# Patient Record
Sex: Female | Born: 1995 | Race: Black or African American | Hispanic: No | Marital: Single | Smoking: Never smoker
Health system: Southern US, Community
[De-identification: ages and names within clinical notes are randomized; demographics above are authoritative.]

## PROBLEM LIST (undated history)

## (undated) ENCOUNTER — Inpatient Hospital Stay (HOSPITAL_COMMUNITY): Payer: Self-pay

## (undated) DIAGNOSIS — K8050 Calculus of bile duct without cholangitis or cholecystitis without obstruction: Secondary | ICD-10-CM

## (undated) DIAGNOSIS — J309 Allergic rhinitis, unspecified: Secondary | ICD-10-CM

## (undated) DIAGNOSIS — F419 Anxiety disorder, unspecified: Secondary | ICD-10-CM

## (undated) DIAGNOSIS — O24419 Gestational diabetes mellitus in pregnancy, unspecified control: Secondary | ICD-10-CM

## (undated) DIAGNOSIS — G4730 Sleep apnea, unspecified: Secondary | ICD-10-CM

## (undated) DIAGNOSIS — E119 Type 2 diabetes mellitus without complications: Secondary | ICD-10-CM

## (undated) HISTORY — DX: Allergic rhinitis, unspecified: J30.9

## (undated) HISTORY — DX: Sleep apnea, unspecified: G47.30

## (undated) HISTORY — PX: WISDOM TOOTH EXTRACTION: SHX21

## (undated) HISTORY — DX: Gestational diabetes mellitus in pregnancy, unspecified control: O24.419

---
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
## 2013-04-07 ENCOUNTER — Encounter (HOSPITAL_COMMUNITY): Payer: Self-pay | Admitting: Emergency Medicine

## 2013-04-07 ENCOUNTER — Emergency Department (INDEPENDENT_AMBULATORY_CARE_PROVIDER_SITE_OTHER)
Admission: EM | Admit: 2013-04-07 | Discharge: 2013-04-07 | Disposition: A | Discharge status: Home / Self Care | Payer: Medicaid Other | Source: Home / Self Care | Attending: Emergency Medicine | Admitting: Emergency Medicine

## 2013-04-07 DIAGNOSIS — J019 Acute sinusitis, unspecified: Secondary | ICD-10-CM

## 2013-04-07 DIAGNOSIS — J020 Streptococcal pharyngitis: Secondary | ICD-10-CM

## 2013-04-07 MED ORDER — HYDROCOD POLST-CHLORPHEN POLST 10-8 MG/5ML PO LQCR: 5.0000 mL | Freq: Two times a day (BID) | ORAL | Status: DC | PRN

## 2013-04-07 MED ORDER — AMOXICILLIN 500 MG PO CAPS: 1000.0000 mg | ORAL_CAPSULE | Freq: Three times a day (TID) | ORAL | Status: DC

## 2013-04-07 NOTE — ED Provider Notes (Signed)
Chief Complaint:   Chief Complaint  Patient presents with  . 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 2 of her brothers were found to have strep. She relates a three-day history of sore throat, then a scratchy throat, nasal congestion with small amounts of yellow-green drainage, watering of her eyes, cough productive of green sputum, chest tightness, subjective fever, myalgias, headache, and nausea. She denies any earache, vomiting, or diarrhea.  Review of Systems:  Other than noted above, the patient denies any of the following symptoms: Systemic:  No fevers, chills, sweats, weight loss or gain, fatigue, or tiredness. Eye:  No redness or discharge. ENT:  No ear pain, drainage, headache, nasal congestion, drainage, sinus pressure, difficulty swallowing, or sore throat. Neck:  No neck pain or swollen glands. Lungs:  No cough, sputum production, hemoptysis, wheezing, chest tightness, shortness of breath or chest pain. GI:  No abdominal pain, nausea, vomiting or diarrhea.  PMFSH:  Past medical history, family history, social history, meds, and allergies were reviewed. She has seasonal allergies.  Physical Exam:   Vital signs:  BP 121/79  Pulse 100  Temp(Src) 97 F (36.1 C) (Oral)  Resp 19  SpO2 97%  LMP 03/15/2013 General:  Alert and oriented.  In no distress.  Skin warm and dry. Eye:  No conjunctival injection or drainage. Lids were normal. ENT:  TMs and canals were normal, without erythema or inflammation.  Nasal mucosa was clear and uncongested, without drainage.  Mucous membranes were moist.  Pharynx was clear with no exudate or drainage.  There were no oral ulcerations or lesions. Neck:  Supple, no adenopathy, tenderness or mass. Lungs:  No respiratory distress.  Lungs were clear to auscultation, without wheezes, rales or rhonchi.  Breath sounds were clear and equal bilaterally.  Heart:   Regular rhythm, without gallops, murmers or rubs. Skin:  Clear, warm, and dry, without rash or lesions.  Labs:   Results for orders placed during the hospital encounter of 04/07/13  POCT RAPID STREP A ([facility] ONLY)      Result Value Range   Streptococcus, Group A Screen (Direct) NEGATIVE  NEGATIVE    Assessment:  The primary encounter diagnosis was Strep throat. A diagnosis of Acute sinusitis was also pertinent to this visit.  Since she has an extensive exposure to strep, I think is reasonable to go ahead and treat with amoxicillin.  Plan:   1.  Meds:  The following meds were prescribed:   Discharge Medication List as of 04/07/2013  2:56 PM    START taking these medications   Details  amoxicillin (AMOXIL) 500 MG capsule Take 2 capsules (1,000 mg total) by mouth 3 (three) times daily., Starting 04/07/2013, Until Discontinued, Normal    chlorpheniramine-HYDROcodone (TUSSIONEX) 10-8 MG/5ML LQCR Take 5 mLs by mouth every 12 (twelve) hours as needed for cough., Starting 04/07/2013, Until Discontinued, Normal        2.  Patient Education/Counseling:  The patient was given appropriate handouts, self care instructions, and instructed in symptomatic relief.   3.  Follow up:  The patient was told to follow up if no better in 3 to 4 days, if becoming worse in any way, and given some red flag symptoms such as persistent symptoms which would prompt immediate return.  Follow up here as needed.      Reuben Likes, MD 04/07/13 [phone]

## 2013-04-07 NOTE — ED Notes (Signed)
Reports runny nose, cough, denies fever, reports drainage with sinus and chest congestion

## 2013-04-09 LAB — CULTURE, GROUP A STREP

## 2013-08-03 ENCOUNTER — Ambulatory Visit (INDEPENDENT_AMBULATORY_CARE_PROVIDER_SITE_OTHER): Payer: Medicaid Other | Admitting: Advanced Practice Midwife

## 2013-08-03 ENCOUNTER — Encounter: Payer: Self-pay | Admitting: Advanced Practice Midwife

## 2013-08-03 VITALS — BP 121/84 | HR 91 | Temp 97.9°F | Wt 277.0 lb

## 2013-08-03 DIAGNOSIS — IMO0001 Reserved for inherently not codable concepts without codable children: Secondary | ICD-10-CM

## 2013-08-03 DIAGNOSIS — E669 Obesity, unspecified: Secondary | ICD-10-CM | POA: Insufficient documentation

## 2013-08-03 DIAGNOSIS — N939 Abnormal uterine and vaginal bleeding, unspecified: Secondary | ICD-10-CM | POA: Insufficient documentation

## 2013-08-03 DIAGNOSIS — Z789 Other specified health status: Secondary | ICD-10-CM

## 2013-08-03 DIAGNOSIS — IMO0002 Reserved for concepts with insufficient information to code with codable children: Secondary | ICD-10-CM

## 2013-08-03 DIAGNOSIS — N926 Irregular menstruation, unspecified: Secondary | ICD-10-CM

## 2013-08-03 DIAGNOSIS — Z3009 Encounter for other general counseling and advice on contraception: Secondary | ICD-10-CM

## 2013-08-03 DIAGNOSIS — Z30017 Encounter for initial prescription of implantable subdermal contraceptive: Secondary | ICD-10-CM

## 2013-08-03 LAB — POCT URINE PREGNANCY: Preg Test, Ur: NEGATIVE

## 2013-08-03 NOTE — Progress Notes (Signed)
Patient is in the office today for a Problem Visit. Patient states her cycle in January only lasted for 3 days and her cycle in February lasted for 2 weeks and was heavy. Patient states she gets really bad cramping, nausea, light headed/ dizziness, and constipation. Patient would also like to know if she is on the right birth control pill. Patient states that her cycles became longer and that her cramping became worse when she started taking the birth control pills. Patient states she hasn't taken a pill for a week now.

## 2013-08-03 NOTE — Progress Notes (Signed)
Nexplanon Procedure Note   PRE-OP DIAGNOSIS: desired long-term, reversible contraception  POST-OP DIAGNOSIS: Same  PROCEDURE: Nexplanon  placement Performing Provider: Dory Horn CNM   Patient education prior to procedure, explained risk, benefits of Nexplanon, reviewed alternative options. Patient reported understanding. Gave consent to continue with procedure.   PROCEDURE:  Pregnancy Text :  Negative Site (check):      left arm         Sterile Preparation:   Betadinex3 Lot # [id number] Expiration Date 01/2016  Insertion site was selected 8 - 10 cm from medial epicondyle and marked along with guiding site using sterile marker. Procedure area was prepped and draped in a sterile fashion. Nexplanon  was inserted subcutaneously.Needle was removed from the insertion site. Nexplanon capsule was palpated by provider and patient to assure satisfactory placement. Dressing applied.  Followup: The patient tolerated the procedure well without complications.  Standard post-procedure care is explained and return precautions are given.  Katherine Preston CNM  Subjective:     Katherine Preston is a 17 y.o. female and is here for a comprehensive physical exam. The patient reports abnormal bleeding. Patient is seen by pediatrician. She has had abnormal periods. Reports she went 6 months without one then had one for a long time and often feels moody, irritable, in addition she wonders if she has endometriosis. Reports painful periods when she does have them. Her pediatrician put her on loestrin and she reports she had frequent bleeding w/ that. She often forgets to take the pill. She would like to try another method. Is not interested in the IUD or ring.   Patient was told she was prediabetic.  Patient sexually active. Not interested in IUD or ring.    History   Social History  . Marital Status: Single    Spouse Name: N/A    Number of Children: N/A  . Years of Education: N/A   Occupational History  .  Not on file.   Social History Main Topics  . Smoking status: Never Smoker   . Smokeless tobacco: Never Used  . Alcohol Use: No  . Drug Use: No  . Sexual Activity: Yes    Partners: Male    Birth Control/ Protection: Pill   Other Topics Concern  . Not on file   Social History Narrative  . No narrative on file   No health maintenance topics applied.  The following portions of the patient's history were reviewed and updated as appropriate: allergies, current medications, past family history, past medical history, past social history, past surgical history and problem list.  Review of Systems A comprehensive review of systems was negative.   Objective:    BP 121/84  Pulse 91  Temp(Src) 97.9 F (36.6 C)  Wt 277 lb (125.646 kg)  LMP 07/22/2013 General appearance: alert and cooperative Head: Normocephalic, without obvious abnormality, atraumatic Eyes: conjunctivae/corneas clear. PERRL, EOM's intact. Fundi benign. Abdomen: soft, non-tender; bowel sounds normal; no masses,  no organomegaly Neurologic: Alert and oriented X 3, normal strength and tone. Normal symmetric reflexes. Normal coordination and gait    Assessment:    Healthy female exam.    Elevated BMI Abnormal Uterine Bleeding Nexplanon insert today    Plan:     See After Visit Summary for Counseling Recommendations  Encouraged weight loss, diet and exercise. Encouraged patient to be evaluated for DM annually. In the future consider workup for PCOS.  Nexplanon today after patient given options for birth control. Reviewed risk and benefits. Patient had time  to read handout.  Patient to RTC PRN.  30 min spent with patient greater than 80% spent in counseling and coordination of care.  Katherine Preston CNM

## 2013-11-13 ENCOUNTER — Ambulatory Visit: Payer: Medicaid Other | Admitting: Advanced Practice Midwife

## 2014-02-27 ENCOUNTER — Encounter: Payer: Self-pay | Admitting: Gastroenterology

## 2014-05-01 ENCOUNTER — Ambulatory Visit: Payer: Medicaid Other | Admitting: Gastroenterology

## 2014-05-01 ENCOUNTER — Ambulatory Visit (INDEPENDENT_AMBULATORY_CARE_PROVIDER_SITE_OTHER): Payer: Medicaid Other | Admitting: Gastroenterology

## 2014-05-01 ENCOUNTER — Encounter: Payer: Self-pay | Admitting: Gastroenterology

## 2014-05-01 VITALS — BP 158/86 | HR 64 | Ht 62.5 in | Wt 278.4 lb

## 2014-05-01 DIAGNOSIS — R1031 Right lower quadrant pain: Secondary | ICD-10-CM

## 2014-05-01 DIAGNOSIS — G8929 Other chronic pain: Secondary | ICD-10-CM | POA: Insufficient documentation

## 2014-05-01 DIAGNOSIS — K5900 Constipation, unspecified: Secondary | ICD-10-CM | POA: Insufficient documentation

## 2014-05-01 MED ORDER — HYOSCYAMINE SULFATE 0.125 MG SL SUBL: 0.2500 mg | SUBLINGUAL_TABLET | SUBLINGUAL | Status: DC | PRN

## 2014-05-01 NOTE — Patient Instructions (Signed)
Research will be speaking with you today We have sent in a prescription to your pharmacy

## 2014-05-01 NOTE — Assessment & Plan Note (Signed)
Bilateral lower abdominal pain is probably related to constipation.  Recommendations #1 trial of hyomax when necessary

## 2014-05-01 NOTE — Assessment & Plan Note (Addendum)
Patient has chronic constipation with only partial relief with defecation.  This is most consistent with IBS/constipation..  I believe this is etiology for her lower abdominal pain.  Recommendations #1 patient will consider enrollment in a IBS/constipation trial.  Failing this, I would consider a trial of Linzess

## 2014-05-01 NOTE — Progress Notes (Signed)
    _                                                                                                                History of Present Illness:  [name] is a 18 year old Afro-American female referred for evaluation of abdominal pain.  She complains of frequent lower abdominal pressure.  This may last 2-3 hours at a time.  It is poorly described.  Pain may sometimes be sharp.  She moves her bowels about once a week.  In the past when she took MiraLAX and was moving her bowels more regularly abdominal pain decreased but never entirely subsided.  She claims that MiraLAX has lost its effectiveness.  There is no history of rectal bleeding.   Past Medical History  Diagnosis Date  . Sleep apnea   . Allergic rhinitis    Past Surgical History  Procedure Laterality Date  . No past surgeries     family history includes Fibroids in her mother. Current Outpatient Prescriptions  Medication Sig Dispense Refill  . etonogestrel (NEXPLANON) 68 MG IMPL implant 1 each by Subdermal route once.     No current facility-administered medications for this visit.   Allergies as of 05/01/2014 - Review Complete 05/01/2014  Allergen Reaction Noted  . Pollen extract  05/01/2014    reports that she has never smoked. She has never used smokeless tobacco. She reports that she does not drink alcohol or use illicit drugs.   Review of Systems: Pertinent positive and negative review of systems were noted in the above HPI section. All other review of systems were otherwise negative.  Vital signs were reviewed in today's medical record Physical Exam: General: Obese female in no acute distress Skin: anicteric Head: Normocephalic and atraumatic Eyes:  sclerae anicteric, EOMI Ears: Normal auditory acuity Mouth: No deformity or lesions Neck: Supple, no masses or thyromegaly Lungs: Clear throughout to auscultation Heart: Regular rate and rhythm; no murmurs, rubs or bruits Abdomen: Soft,  and non  distended. No masses, hepatosplenomegaly or hernias noted. Normal Bowel sounds.  There is mild tenderness bilaterally in the lower quadrants Rectal:deferred Musculoskeletal: Symmetrical with no gross deformities  Skin: No lesions on visible extremities Pulses:  Normal pulses noted Extremities: No clubbing, cyanosis, edema or deformities noted Neurological: Alert oriented x 4, grossly nonfocal Cervical Nodes:  No significant cervical adenopathy Inguinal Nodes: No significant inguinal adenopathy Psychological:  Alert and cooperative. Normal mood and affect  See Assessment and Plan under Problem List

## 2014-08-22 ENCOUNTER — Emergency Department (HOSPITAL_COMMUNITY)
Admission: EM | Admit: 2014-08-22 | Discharge: 2014-08-22 | Disposition: A | Discharge status: Home / Self Care | Payer: BLUE CROSS/BLUE SHIELD | Attending: Emergency Medicine | Admitting: Emergency Medicine

## 2014-08-22 ENCOUNTER — Encounter (HOSPITAL_COMMUNITY): Payer: Self-pay | Admitting: *Deleted

## 2014-08-22 DIAGNOSIS — F419 Anxiety disorder, unspecified: Secondary | ICD-10-CM | POA: Insufficient documentation

## 2014-08-22 DIAGNOSIS — Z8709 Personal history of other diseases of the respiratory system: Secondary | ICD-10-CM | POA: Diagnosis not present

## 2014-08-22 DIAGNOSIS — Z8669 Personal history of other diseases of the nervous system and sense organs: Secondary | ICD-10-CM | POA: Diagnosis not present

## 2014-08-22 DIAGNOSIS — R0602 Shortness of breath: Secondary | ICD-10-CM | POA: Diagnosis present

## 2014-08-22 HISTORY — DX: Anxiety disorder, unspecified: F41.9

## 2014-08-22 LAB — CBG MONITORING, ED: GLUCOSE-CAPILLARY: 95 mg/dL (ref 70–99)

## 2014-08-22 MED ORDER — LORAZEPAM 1 MG PO TABS
1.0000 mg | ORAL_TABLET | Freq: Once | ORAL | Status: AC
  Administered 2014-08-22: 1 mg via ORAL
  Filled 2014-08-22: qty 1

## 2014-08-22 NOTE — ED Notes (Addendum)
Patient became short of breath following an argument with someone. Patient has no history of respiratory or other medical history. Patient was tachycardia when EMS arrived. Co-worker called EMS. Patient states she is stresses out. Patient's behavior is really unusual.

## 2014-08-22 NOTE — Discharge Instructions (Signed)
Your emergency department evaluation was unremarkable for an emergent condition today.  You can finish your workup and evaluation in the outpatient setting with your primary care doctor.  Your EKG was abnormal, but did not show any ischemic changes.  Please follow-up with your primary care doctor for repeat EKG in 2-3 weeks.  Panic Attacks Panic attacks are sudden, short-livedsurges of severe anxiety, fear, or discomfort. They may occur for no reason when you are relaxed, when you are anxious, or when you are sleeping. Panic attacks may occur for a number of reasons:   Healthy people occasionally have panic attacks in extreme, life-threatening situations, such as war or natural disasters. Normal anxiety is a protective mechanism of the body that helps [place] react to danger (fight or flight response).  Panic attacks are often seen with anxiety disorders, such as panic disorder, social anxiety disorder, generalized anxiety disorder, and phobias. Anxiety disorders cause excessive or uncontrollable anxiety. They may interfere with your relationships or other life activities.  Panic attacks are sometimes seen with other mental illnesses, such as depression and posttraumatic stress disorder.  Certain medical conditions, prescription medicines, and drugs of abuse can cause panic attacks. SYMPTOMS  Panic attacks start suddenly, peak within 20 minutes, and are accompanied by four or more of the following symptoms:  Pounding heart or fast heart rate (palpitations).  Sweating.  Trembling or shaking.  Shortness of breath or feeling smothered.  Feeling choked.  Chest pain or discomfort.  Nausea or strange feeling in your stomach.  Dizziness, light-headedness, or feeling like you will faint.  Chills or hot flushes.  Numbness or tingling in your lips or hands and feet.  Feeling that things are not real or feeling that you are not yourself.  Fear of losing control or going crazy.  Fear of  dying. Some of these symptoms can mimic serious medical conditions. For example, you may think you are having a heart attack. Although panic attacks can be very scary, they are not life threatening. DIAGNOSIS  Panic attacks are diagnosed through an assessment by your health care provider. Your health care provider will ask questions about your symptoms, such as where and when they occurred. Your health care provider will also ask about your medical history and use of alcohol and drugs, including prescription medicines. Your health care provider may order blood tests or other studies to rule out a serious medical condition. Your health care provider may refer you to a mental health professional for further evaluation. TREATMENT   Most healthy people who have one or two panic attacks in an extreme, life-threatening situation will not require treatment.  The treatment for panic attacks associated with anxiety disorders or other mental illness typically involves counseling with a mental health professional, medicine, or a combination of both. Your health care provider will help determine what treatment is best for you.  Panic attacks due to physical illness usually go away with treatment of the illness. If prescription medicine is causing panic attacks, talk with your health care provider about stopping the medicine, decreasing the dose, or substituting another medicine.  Panic attacks due to alcohol or drug abuse go away with abstinence. Some adults need professional help in order to stop drinking or using drugs. HOME CARE INSTRUCTIONS   Take all medicines as directed by your health care provider.   Schedule and attend follow-up visits as directed by your health care provider. It is important to keep all your appointments. SEEK MEDICAL CARE IF:  You are  not able to take your medicines as prescribed.  Your symptoms do not improve or get worse. SEEK IMMEDIATE MEDICAL CARE IF:   You experience  panic attack symptoms that are different than your usual symptoms.  You have serious thoughts about hurting yourself or others.  You are taking medicine for panic attacks and have a serious side effect. MAKE SURE YOU:  Understand these instructions.  Will watch your condition.  Will get help right away if you are not doing well or get worse. Document Released: 05/10/2005 Document Revised: 05/15/2013 Document Reviewed: 12/22/2012 [facility] Patient Information 2015 [place], [place]. This information is not intended to replace advice given to you by your health care provider. Make sure you discuss any questions you have with your health care provider.

## 2014-08-22 NOTE — ED Provider Notes (Signed)
CSN: [id number]     Arrival date & time 08/22/14  1624 History  This chart was scribed for non-physician practitioner, Roxy Horseman, working with Mancel Bale, MD by Richarda Overlie, ED Scribe. This patient was seen in room WTR8/WTR8 and the patient's care was started at 4:45 PM.   Chief Complaint  Patient presents with  . Shortness of Breath   The history is provided by the patient. No language interpreter was used.   HPI Comments: Katherine Preston is a 19 y.o. female with a history of anxiety who presents to the Emergency Department complaining of shortness of breath PTA following an argument with her best friend. Pt states she felt light headed as well. Pt states she was fine when she went to work earlier today. She states that she has not had a panic attack since Highschool. Pt reports no pertinent past medical history. She denies any chest pain or abdominal pain. Has not taken anything for her symptoms.  Past Medical History  Diagnosis Date  . Sleep apnea   . Allergic rhinitis   . Anxiety     per patient, no diagnosis   Past Surgical History  Procedure Laterality Date  . No past surgeries     Family History  Problem Relation Age of Onset  . Fibroids Mother    History  Substance Use Topics  . Smoking status: Never Smoker   . Smokeless tobacco: Never Used  . Alcohol Use: No   OB History    Gravida Para Term Preterm AB TAB SAB Ectopic Multiple Living       Review of Systems  Constitutional: Negative for fever and chills.  Respiratory: Positive for shortness of breath.   Cardiovascular: Negative for chest pain.  Gastrointestinal: Negative for nausea, vomiting, abdominal pain, diarrhea and constipation.  Genitourinary: Negative for dysuria.  Neurological: Positive for light-headedness.  All other systems reviewed and are negative.   Allergies  Pollen extract  Home Medications   Prior to Admission medications   Medication Sig Start Date End  Date Taking? Authorizing Provider  etonogestrel (NEXPLANON) 68 MG IMPL implant 1 each by Subdermal route once.    Historical Provider, MD  hyoscyamine (LEVSIN SL) 0.125 MG SL tablet Place 2 tablets (0.25 mg total) under the tongue every 4 (four) hours as needed. 05/01/14   Louis Meckel, MD   BP 107/74 mmHg  Pulse 90  Temp(Src) 98.3 F (36.8 C) (Oral)  Resp 20  SpO2 100%  LMP 08/22/2014 Physical Exam  Constitutional: She is oriented to person, place, and time. She appears well-developed and well-nourished.  HENT:  Head: Normocephalic and atraumatic.  Eyes: Conjunctivae and EOM are normal. Pupils are equal, round, and reactive to light. Right eye exhibits no discharge. Left eye exhibits no discharge.  Neck: Normal range of motion. Neck supple. No tracheal deviation present.  Cardiovascular: Normal rate and regular rhythm.  Exam reveals no gallop and no friction rub.   No murmur heard. Pulmonary/Chest: Effort normal and breath sounds normal. No respiratory distress. She has no wheezes. She has no rales. She exhibits no tenderness.  Abdominal: Soft. Bowel sounds are normal. She exhibits no distension and no mass. There is no tenderness. There is no rebound and no guarding.  Musculoskeletal: Normal range of motion. She exhibits no edema or tenderness.  Neurological: She is alert and oriented to person, place, and time.  Skin: Skin is warm and dry.  Psychiatric: She has  a normal mood and affect. Her behavior is normal. Judgment and thought content normal.  Nursing note and vitals reviewed.   ED Course  Procedures   DIAGNOSTIC STUDIES: Oxygen Saturation is 100% on RA, normal by my interpretation.    COORDINATION OF CARE: 4:48 PM Discussed treatment plan with pt at bedside and pt agreed to plan.   Labs Review Labs Reviewed  CBG MONITORING, ED   Results for orders placed or performed during the hospital encounter of 08/22/14  CBG monitoring, ED  Result Value Ref Range    Glucose-Capillary 95 70 - 99 mg/dL   No results found.    Imaging Review No results found.   EKG Interpretation   Date/Time:  Thursday August 22 2014 17:32:12 EDT Ventricular Rate:  97 PR Interval:  155 QRS Duration: 76 QT Interval:  367 QTC Calculation: 466 R Axis:   48 Text Interpretation:  Sinus rhythm Nonspecific T abnormalities, anterior  leads No previous ECGs available Confirmed by Effie ShyWENTZ  MD, Mechele CollinELLIOTT ([id number])  on 08/22/2014 5:36:26 PM      MDM   Final diagnoses:  Anxiety   Patient with symptoms consistent with panic attack after argument with her best friend.  She has not had a panic attack in a couple of years.  Had some SOB, anxiety, and felt "stressed out."    CBG is normal.  EKG has nonspecific t-wave changes, but no ischemic changes.  Will recommend PCP follow-up as EKG is abnormal.    Patient feels improved with ativan.  Orthostatic VS are reassuring.  Patient ambulates without difficulty.  She is stable and ready for discharge.  I personally performed the services described in this documentation, which was scribed in my presence. The recorded information has been reviewed and is accurate.      Roxy Horsemanobert Aadil Sur, PA-C 08/22/14 1758  Mancel BaleElliott Wentz, MD 08/22/14 [phone]

## 2014-09-05 ENCOUNTER — Encounter: Payer: Self-pay | Admitting: Certified Nurse Midwife

## 2014-09-05 ENCOUNTER — Ambulatory Visit (INDEPENDENT_AMBULATORY_CARE_PROVIDER_SITE_OTHER): Payer: BLUE CROSS/BLUE SHIELD | Admitting: Certified Nurse Midwife

## 2014-09-05 VITALS — BP 98/67 | HR 76 | Temp 99.2°F | Ht 63.0 in | Wt 243.0 lb

## 2014-09-05 DIAGNOSIS — N939 Abnormal uterine and vaginal bleeding, unspecified: Secondary | ICD-10-CM

## 2014-09-05 DIAGNOSIS — Z01419 Encounter for gynecological examination (general) (routine) without abnormal findings: Secondary | ICD-10-CM

## 2014-09-05 LAB — TRIGLYCERIDES: TRIGLYCERIDES: 57 mg/dL (ref <150)

## 2014-09-05 LAB — COMPREHENSIVE METABOLIC PANEL
ALBUMIN: 4.1 g/dL (ref 3.5–5.2)
ALT: 13 U/L (ref 0–35)
AST: 14 U/L (ref 0–37)
Alkaline Phosphatase: 56 U/L (ref 39–117)
BILIRUBIN TOTAL: 0.2 mg/dL (ref 0.2–1.2)
BUN: 8 mg/dL (ref 6–23)
CALCIUM: 9.1 mg/dL (ref 8.4–10.5)
CHLORIDE: 106 meq/L (ref 96–112)
CO2: 25 meq/L (ref 19–32)
Creat: 0.81 mg/dL (ref 0.50–1.10)
GLUCOSE: 97 mg/dL (ref 70–99)
Potassium: 4.3 mEq/L (ref 3.5–5.3)
SODIUM: 138 meq/L (ref 135–145)
TOTAL PROTEIN: 6.6 g/dL (ref 6.0–8.3)

## 2014-09-05 LAB — CHOLESTEROL, TOTAL: CHOLESTEROL: 110 mg/dL (ref 0–200)

## 2014-09-05 LAB — HDL CHOLESTEROL: HDL: 24 mg/dL — AB (ref >=46)

## 2014-09-05 MED ORDER — IBUPROFEN 800 MG PO TABS: 800.0000 mg | ORAL_TABLET | Freq: Three times a day (TID) | ORAL | Status: DC | PRN

## 2014-09-05 NOTE — Addendum Note (Signed)
Addended by: Marya LandryFOSTER, Jonathin Heinicke D on: 09/05/2014 05:33 PM   Modules accepted: Orders

## 2014-09-05 NOTE — Progress Notes (Signed)
Patient ID: Katherine Preston, female   DOB: 06-07-1995, 19 y.o.   MRN: [id number]    Subjective:      Katherine Preston is a 19 y.o. female here for a routine exam.  Current complaints: painful menses, periods last 2-3 weeks since Feb., clots about dime sized.  Has dizziness and head aches.  Went to the hospital last week for dizziness & SOB, at [facility] and dx with anxiety.   Maternal Aunt had BCA in her 24's.  Denies any breast abnormalities.  Sexually active, recent partner change 1 month ago.  Employed and going to school.  [employer] for weight loss since December.    Personal health questionnaire:  Is patient Ashkenazi Jewish, have a family history of breast and/or ovarian cancer: yes Is there a family history of uterine cancer diagnosed at age < 71, gastrointestinal cancer, urinary tract cancer, family member who is a Personnel officer syndrome-associated carrier: no Is the patient overweight and hypertensive, family history of diabetes, personal history of gestational diabetes, preeclampsia or PCOS: yes Is patient over 26, have PCOS,  family history of premature CHD under age 89, diabetes, smoke, have hypertension or peripheral artery disease:  no At any time, has a partner hit, kicked or otherwise hurt or frightened you?: no Over the past 2 weeks, have you felt down, depressed or hopeless?: no Over the past 2 weeks, have you felt little interest or pleasure in doing things?:no   Gynecologic History Patient's last menstrual period was 09/01/2014. Contraception: Nexplanon Last Pap: none.  Last mammogram: None.   Obstetric History OB History  Gravida Para Term Preterm AB SAB TAB Ectopic Multiple Living         Past Medical History  Diagnosis Date  . Sleep apnea   . Allergic rhinitis   . Anxiety     per patient, no diagnosis    Past Surgical History  Procedure Laterality Date  . No past surgeries       Current outpatient prescriptions:  .  etonogestrel  (NEXPLANON) 68 MG IMPL implant, 1 each by Subdermal route once., Disp: , Rfl:  .  phentermine 37.5 MG capsule, Take 37.5 mg by mouth every morning., Disp: , Rfl:  .  ibuprofen (ADVIL,MOTRIN) 800 MG tablet, Take 1 tablet (800 mg total) by mouth every 8 (eight) hours as needed., Disp: 30 tablet, Rfl: 0 Allergies  Allergen Reactions  . Pollen Extract     History  Substance Use Topics  . Smoking status: Never Smoker   . Smokeless tobacco: Never Used  . Alcohol Use: 0.0 oz/week    0 Standard drinks or equivalent per week     Comment: Occassional     Family History  Problem Relation Age of Onset  . Fibroids Mother       Review of Systems  Constitutional: negative for fatigue and unintentional weight loss Respiratory: negative for cough and wheezing Cardiovascular: negative for chest pain, fatigue and palpitations Gastrointestinal: negative for abdominal pain and change in bowel habits Musculoskeletal:negative for myalgias Neurological: negative for gait problems and tremors Behavioral/Psych: negative for abusive relationship, depression Endocrine: negative for temperature intolerance   Genitourinary:negative for genital lesions, hot flashes, sexual problems and vaginal discharge. + for abnormal menstrual periods Integument/breast: negative for breast lump, breast tenderness, nipple discharge and skin lesion(s)    Objective:       BP 98/67 mmHg  Pulse 76  Temp(Src) 99.2 F (37.3 C)  Ht 5'  3" (1.6 m)  Wt 110.224 kg (243 lb)  BMI 43.06 kg/m2  LMP 09/01/2014 General:   alert  Skin:   no rash or abnormalities  Lungs:   clear to auscultation bilaterally  Heart:   regular rate and rhythm, S1, S2 normal, no murmur, click, rub or gallop  Breasts:   normal without suspicious masses, skin or nipple changes or axillary nodes  Abdomen:  normal findings: no organomegaly, soft, non-tender and no hernia, difficult to assess d/t body habitus  Pelvis:  External genitalia: normal general  appearance Urinary system: urethral meatus normal and bladder without fullness, nontender Vaginal: normal without tenderness, induration or masses. + menses Cervix: strawberry appearance, -CVA tenderness Adnexa: normal bimanual exam, difficult to assess d/t body habitus Uterus: anteverted and non-tender, normal size   Lab Review Urine pregnancy test Labs reviewed yes Radiologic studies reviewed no  50% of 30 min visit spent on counseling and coordination of care.   Assessment:    Healthy female exam.   Obesity AUB   Plan:    Education reviewed: depression evaluation, low fat, low cholesterol diet, safe sex/STD prevention, self breast exams, skin cancer screening and weight bearing exercise. Contraception: Nexplanon. Follow up in: 2 weeks roughly for nexplanon removal and Skyla insertion.   Meds ordered this encounter  Medications  . phentermine 37.5 MG capsule    Sig: Take 37.5 mg by mouth every morning.  Marland Kitchen. ibuprofen (ADVIL,MOTRIN) 800 MG tablet    Sig: Take 1 tablet (800 mg total) by mouth every 8 (eight) hours as needed.    Dispense:  30 tablet    Refill:  0   Orders Placed This Encounter  Procedures  . US Transvaginal Non-OB    Standing Status: Future     Number of Occurrences:      Standing Expiration Date: 11/05/2015    Order Specific Question:  Reason for Exam (SYMPTOM  OR DIAGNOSIS REQUIRED)    Answer:  AUB    Order Specific Question:  Preferred imaging location?    Answer:  Internal  . HIV antibody (with reflex)  . Hepatitis B surface antigen  . RPR  . Hepatitis C antibody  . CBC with Differential/Platelet  . Comprehensive metabolic panel  . TSH  . Cholesterol, total  . Triglycerides  . HDL cholesterol

## 2014-09-06 ENCOUNTER — Telehealth: Payer: Self-pay | Admitting: Obstetrics

## 2014-09-06 LAB — GC/CHLAMYDIA PROBE AMP
CT Probe RNA: NEGATIVE
GC PROBE AMP APTIMA: NEGATIVE

## 2014-09-06 LAB — CBC WITH DIFFERENTIAL/PLATELET
Basophils Absolute: 0 10*3/uL (ref 0.0–0.1)
Basophils Relative: 0 % (ref 0–1)
Eosinophils Absolute: 0.2 10*3/uL (ref 0.0–0.7)
Eosinophils Relative: 3 % (ref 0–5)
HEMATOCRIT: 38.7 % (ref 36.0–46.0)
Hemoglobin: 12.1 g/dL (ref 12.0–15.0)
LYMPHS ABS: 3.2 10*3/uL (ref 0.7–4.0)
Lymphocytes Relative: 44 % (ref 12–46)
MCH: 24.5 pg — AB (ref 26.0–34.0)
MCHC: 31.3 g/dL (ref 30.0–36.0)
MCV: 78.3 fL (ref 78.0–100.0)
MONO ABS: 0.5 10*3/uL (ref 0.1–1.0)
MPV: 9.8 fL (ref 8.6–12.4)
Monocytes Relative: 7 % (ref 3–12)
Neutro Abs: 3.3 10*3/uL (ref 1.7–7.7)
Neutrophils Relative %: 46 % (ref 43–77)
Platelets: 325 10*3/uL (ref 150–400)
RBC: 4.94 MIL/uL (ref 3.87–5.11)
RDW: 15.9 % — ABNORMAL HIGH (ref 11.5–15.5)
WBC: 7.2 10*3/uL (ref 4.0–10.5)

## 2014-09-06 LAB — PAP IG W/ RFLX HPV ASCU

## 2014-09-06 LAB — HEPATITIS C ANTIBODY: HCV Ab: NEGATIVE

## 2014-09-06 LAB — HIV ANTIBODY (ROUTINE TESTING W REFLEX): HIV 1&2 Ab, 4th Generation: NONREACTIVE

## 2014-09-06 LAB — RPR

## 2014-09-06 LAB — TSH: TSH: 1.627 u[IU]/mL (ref 0.350–4.500)

## 2014-09-06 LAB — HEPATITIS B SURFACE ANTIGEN: Hepatitis B Surface Ag: NEGATIVE

## 2014-09-06 NOTE — Telephone Encounter (Signed)
09/06/2014 - Patient returned call and verified she does have [employer] insurance and she is working to have her last name corrected on card. Asked that she please bring card to appointment. brm

## 2014-09-09 ENCOUNTER — Other Ambulatory Visit: Payer: Self-pay | Admitting: Certified Nurse Midwife

## 2014-09-09 DIAGNOSIS — N760 Acute vaginitis: Principal | ICD-10-CM

## 2014-09-09 DIAGNOSIS — B9689 Other specified bacterial agents as the cause of diseases classified elsewhere: Secondary | ICD-10-CM

## 2014-09-09 LAB — SURESWAB BACTERIAL VAGINOSIS/ITIS
Atopobium vaginae: 6.6 Log (cells/mL)
C. TROPICALIS, DNA: NOT DETECTED
C. albicans, DNA: NOT DETECTED
C. glabrata, DNA: NOT DETECTED
C. parapsilosis, DNA: NOT DETECTED
GARDNERELLA VAGINALIS: 7.2 Log (cells/mL)
LACTOBACILLUS SPECIES: NOT DETECTED Log (cells/mL)
MEGASPHAERA SPECIES: 7.6 Log (cells/mL)
T. vaginalis RNA, QL TMA: NOT DETECTED

## 2014-09-09 MED ORDER — TINIDAZOLE 500 MG PO TABS: 2.0000 g | ORAL_TABLET | Freq: Every day | ORAL | Status: AC

## 2014-09-09 NOTE — Telephone Encounter (Signed)
CLOSED ENCOUNTER

## 2014-09-10 ENCOUNTER — Other Ambulatory Visit: Payer: Self-pay | Admitting: Certified Nurse Midwife

## 2014-09-10 DIAGNOSIS — N939 Abnormal uterine and vaginal bleeding, unspecified: Secondary | ICD-10-CM

## 2014-09-11 ENCOUNTER — Other Ambulatory Visit: Payer: Medicaid Other

## 2014-09-16 ENCOUNTER — Telehealth: Payer: Self-pay

## 2014-09-16 NOTE — Telephone Encounter (Signed)
called patient with appt date and time for wh u/s 09/24/14 at 1:15pm

## 2014-09-17 ENCOUNTER — Telehealth: Payer: Self-pay | Admitting: *Deleted

## 2014-09-17 NOTE — Telephone Encounter (Signed)
Patient expressed interest at her recent appointment to have her Nexplanon Removed and a Skyla inserted. Patient is leaving to go out of state and then out of the country Sep 26, 2014 and not returning until July 2016. Offered patient several different appointments and patient declined. Patient elected to continue with the Nexplanon until after she returns. Patient states Nexplanon is not due to come out for 2 years.

## 2014-09-24 ENCOUNTER — Ambulatory Visit (HOSPITAL_COMMUNITY): Admission: RE | Admit: 2014-09-24 | Payer: BLUE CROSS/BLUE SHIELD | Source: Ambulatory Visit

## 2015-07-13 ENCOUNTER — Emergency Department (INDEPENDENT_AMBULATORY_CARE_PROVIDER_SITE_OTHER): Payer: BLUE CROSS/BLUE SHIELD

## 2015-07-13 ENCOUNTER — Emergency Department (HOSPITAL_COMMUNITY)
Admission: EM | Admit: 2015-07-13 | Discharge: 2015-07-13 | Disposition: A | Discharge status: Home / Self Care | Payer: BLUE CROSS/BLUE SHIELD | Source: Home / Self Care | Attending: Family Medicine | Admitting: Family Medicine

## 2015-07-13 ENCOUNTER — Encounter (HOSPITAL_COMMUNITY): Payer: Self-pay | Admitting: Emergency Medicine

## 2015-07-13 DIAGNOSIS — M25511 Pain in right shoulder: Secondary | ICD-10-CM

## 2015-07-13 MED ORDER — KETOROLAC TROMETHAMINE 60 MG/2ML IM SOLN
INTRAMUSCULAR | Status: AC
  Filled 2015-07-13: qty 2

## 2015-07-13 MED ORDER — NAPROXEN 500 MG PO TABS: 500.0000 mg | ORAL_TABLET | Freq: Two times a day (BID) | ORAL | Status: DC

## 2015-07-13 MED ORDER — KETOROLAC TROMETHAMINE 60 MG/2ML IM SOLN
60.0000 mg | Freq: Once | INTRAMUSCULAR | Status: AC
  Administered 2015-07-13: 60 mg via INTRAMUSCULAR

## 2015-07-13 NOTE — Discharge Instructions (Signed)
It was a pleasure to see you today.  The x-ray of the RIGHT SHOULDER is negative for fracture or dislocation.  You were given a shot of Toradol (ketorolac)  intramuscularly for the pain.    You may begin taking NAPROXEN  tablets, take 1 tablet by mouth every 12 hours; begin this evening or tomorrow morning.   Shoulder sling.  Contact your primary physician for follow up if you continue to have pain.

## 2015-07-13 NOTE — ED Notes (Signed)
The patient presented to the [facility] with a complaint of right arm and shoulder pain that started this morning secondary to sleeping on it per the patient. The patient did state a decrease in ROM in that shoulder an arm.

## 2015-07-13 NOTE — ED Provider Notes (Addendum)
CSN: [id number]     Arrival date & time 07/13/15  1306 History   First MD Initiated Contact with Patient 07/13/15 1444     Chief Complaint  Patient presents with  . Shoulder Pain  . Arm Pain   (Consider location/radiation/quality/duration/timing/severity/associated sxs/prior Treatment) Patient is a 20 y.o. female presenting with shoulder pain and arm pain. The history is provided by the patient. No language interpreter was used.  Shoulder Pain Associated symptoms: no fever   Arm Pain   Patient presents with acute onset R shoulder pain, present when she awoke from sleep in her bed this morning around  8am.  No prior trauma.  No radiating pain in the R hand.  Has not taken anything for the pain this morning. Denies any medication allergies.  Denies any PMHx.    Past Medical History  Diagnosis Date  . Sleep apnea   . Allergic rhinitis   . Anxiety     per patient, no diagnosis   Past Surgical History  Procedure Laterality Date  . No past surgeries     Family History  Problem Relation Age of Onset  . Fibroids Mother    Social History  Substance Use Topics  . Smoking status: Never Smoker   . Smokeless tobacco: Never Used  . Alcohol Use: 0.0 oz/week    0 Standard drinks or equivalent per week     Comment: Occassional    OB History    Gravida Para Term Preterm AB TAB SAB Ectopic Multiple Living       Review of Systems  Constitutional: Negative for fever, chills, diaphoresis, activity change and appetite change.  Neurological: Negative for tremors, weakness and numbness.  All other systems reviewed and are negative.   Allergies  Pollen extract  Home Medications   Prior to Admission medications   Medication Sig Start Date End Date Taking? Authorizing Provider  etonogestrel (NEXPLANON) 68 MG IMPL implant 1 each by Subdermal route once.   Yes Historical Provider, MD  ibuprofen (ADVIL,MOTRIN) 800 MG tablet Take 1 tablet (800 mg total) by mouth every  8 (eight) hours as needed. 09/05/14   Rachelle A Denney, CNM  phentermine 37.5 MG capsule Take 37.5 mg by mouth every morning.    Historical Provider, MD   Meds Ordered and Administered this Visit   Medications  ketorolac (TORADOL) injection 60 mg (not administered)    BP 120/66 mmHg  Pulse 70  Temp(Src) 98.7 F (37.1 C) (Oral)  Resp 16  SpO2 100%  LMP 06/29/2015 (Exact Date) No data found.   Physical Exam  Constitutional: She appears well-developed and well-nourished. No distress.  Neck: Neck supple.  Musculoskeletal: She exhibits tenderness. She exhibits no edema.  RIGHT SHOULDER no clear anatomic asymmetry.  Tenderness to palpate over anterior shoulder.  She is unable to passively or actively raise to horizontal with abduction or flexion.  Unable to internally rotate.  Palpable radial pulses bilaterally. Sensation intact in both hands; handgrip full and symmetric.   Flexion/extension at the elbow symmetric and full bilaterally.  Skin: She is not diaphoretic.    ED Course  Procedures (including critical care time)  Labs Review Labs Reviewed - No data to display  Imaging Review No results found.   Visual Acuity Review  Right Eye Distance:   Left Eye Distance:   Bilateral Distance:    Right Eye Near:   Left Eye Near:    Bilateral Near:  MDM   1. Shoulder pain, acute, right    Patient with anterior RIGHT shoulder pain, acute onset without history of trauma.  X-ray R shoulder performed in [facility], negative for fracture or dislocation, films reviewed by me.   Toradol  IM in [facility] today. NSAIDs for pain at home.  Follow up with Dr. Everlene Other at [facility] in [place].   Paula Compton, MD    Barbaraann Barthel, MD 07/13/15 1455  Barbaraann Barthel, MD 07/13/15 [phone]

## 2015-09-18 ENCOUNTER — Ambulatory Visit (INDEPENDENT_AMBULATORY_CARE_PROVIDER_SITE_OTHER): Payer: BLUE CROSS/BLUE SHIELD | Admitting: Certified Nurse Midwife

## 2015-09-18 ENCOUNTER — Ambulatory Visit: Payer: BLUE CROSS/BLUE SHIELD | Admitting: Certified Nurse Midwife

## 2015-09-18 ENCOUNTER — Encounter: Payer: Self-pay | Admitting: Certified Nurse Midwife

## 2015-09-18 VITALS — BP 125/78 | HR 81 | Wt 268.0 lb

## 2015-09-18 DIAGNOSIS — Z1389 Encounter for screening for other disorder: Secondary | ICD-10-CM

## 2015-09-18 DIAGNOSIS — F419 Anxiety disorder, unspecified: Secondary | ICD-10-CM

## 2015-09-18 DIAGNOSIS — Z01419 Encounter for gynecological examination (general) (routine) without abnormal findings: Secondary | ICD-10-CM | POA: Diagnosis not present

## 2015-09-18 DIAGNOSIS — R319 Hematuria, unspecified: Secondary | ICD-10-CM

## 2015-09-18 DIAGNOSIS — N921 Excessive and frequent menstruation with irregular cycle: Secondary | ICD-10-CM

## 2015-09-18 DIAGNOSIS — Z975 Presence of (intrauterine) contraceptive device: Secondary | ICD-10-CM

## 2015-09-18 DIAGNOSIS — F329 Major depressive disorder, single episode, unspecified: Secondary | ICD-10-CM

## 2015-09-18 DIAGNOSIS — F32A Depression, unspecified: Secondary | ICD-10-CM

## 2015-09-18 LAB — POCT URINALYSIS DIPSTICK
Bilirubin, UA: NEGATIVE
Blood, UA: 50
Glucose, UA: NEGATIVE
KETONES UA: NEGATIVE
LEUKOCYTES UA: NEGATIVE
Nitrite, UA: NEGATIVE
PH UA: 6
PROTEIN UA: NEGATIVE
SPEC GRAV UA: 1.01
Urobilinogen, UA: NEGATIVE

## 2015-09-18 NOTE — Addendum Note (Signed)
Addended by: Marya LandryFOSTER, Aldora Perman D on: 09/18/2015 02:33 PM   Modules accepted: Orders

## 2015-09-18 NOTE — Progress Notes (Signed)
Patient ID: Katherine Preston, female   DOB: 1996/04/03, 20 y.o.   MRN: [id number]    Subjective:        Katherine Preston is a 20 y.o. female here for a routine exam.  Current complaints: painful menses, periods last 2-3 weeks, denies clots. Has dizziness and head aches. Maternal Aunt had BCA in her 72's. Denies any breast abnormalities. Sexually active, same partner. Employed and going to school. [employer] for weight loss last year not currently taking.    Personal health questionnaire:  Is patient Ashkenazi Jewish, have a family history of breast and/or ovarian cancer: yes Is there a family history of uterine cancer diagnosed at age < 27, gastrointestinal cancer, urinary tract cancer, family member who is a Personnel officer syndrome-associated carrier: no Is the patient overweight and hypertensive, family history of diabetes, personal history of gestational diabetes, preeclampsia or PCOS: yes Is patient over 47, have PCOS,  family history of premature CHD under age 48, diabetes, smoke, have hypertension or peripheral artery disease:  no At any time, has a partner hit, kicked or otherwise hurt or frightened you?: no Over the past 2 weeks, have you felt down, depressed or hopeless?: yes, not seeing a Veterinary surgeon.  Had been dx with aniety and states that it is better, is not on medication for that.  Over the past 2 weeks, have you felt little interest or pleasure in doing things?:no   Gynecologic History No LMP recorded. Contraception: Nexplanon Last Pap: 08/2014. Results were: normal Last mammogram: N/A.   Obstetric History OB History  Gravida Para Term Preterm AB SAB TAB Ectopic Multiple Living         Past Medical History  Diagnosis Date  . Sleep apnea   . Allergic rhinitis   . Anxiety     per patient, no diagnosis    Past Surgical History  Procedure Laterality Date  . No past surgeries       Current outpatient prescriptions:  .  etonogestrel (NEXPLANON) 68  MG IMPL implant, 1 each by Subdermal route once., Disp: , Rfl:  Allergies  Allergen Reactions  . Pollen Extract     Social History  Substance Use Topics  . Smoking status: Never Smoker   . Smokeless tobacco: Never Used  . Alcohol Use: 0.0 oz/week    0 Standard drinks or equivalent per week     Comment: Occassional     Family History  Problem Relation Age of Onset  . Fibroids Mother       Review of Systems  Constitutional: negative for fatigue and weight loss Respiratory: negative for cough and wheezing Cardiovascular: negative for chest pain, fatigue and palpitations Gastrointestinal: negative for abdominal pain and change in bowel habits Musculoskeletal:negative for myalgias Neurological: negative for gait problems and tremors Behavioral/Psych: negative for abusive relationship, depression Endocrine: negative for temperature intolerance   Genitourinary:+ for abnormal menstrual periods, negative for genital lesions, hot flashes, sexual problems and vaginal discharge Integument/breast: negative for breast lump, breast tenderness, nipple discharge and skin lesion(s)    Objective:       BP 125/78 mmHg  Pulse 81  Wt 268 lb (121.564 kg) General:   alert  Skin:   no rash or abnormalities  Lungs:   clear to auscultation bilaterally  Heart:   regular rate and rhythm, S1, S2 normal, no murmur, click, rub or gallop  Breasts:   normal without suspicious masses, skin or nipple changes or axillary nodes  Abdomen:  normal findings: no organomegaly, soft, non-tender and no hernia obese  Pelvis:  External genitalia: normal general appearance Urinary system: urethral meatus normal and bladder without fullness, nontender Vaginal: normal without tenderness, induration or masses Cervix: normal appearance Adnexa: normal bimanual exam Uterus: anteverted and non-tender, normal size   Lab Review Urine pregnancy test Labs reviewed yes Radiologic studies reviewed no  50% of 30 min  visit spent on counseling and coordination of care.   Assessment:    Healthy female exam.   Family hx of BCA  Morbid obesity  AUB  Plan:    Education reviewed: calcium supplements, depression evaluation, low fat, low cholesterol diet, safe sex/STD prevention, self breast exams, skin cancer screening and weight bearing exercise. Contraception: Nexplanon. Follow up in: 1 month for Nexplanon removal.   No orders of the defined types were placed in this encounter.   Orders Placed This Encounter  Procedures  . Urine Culture  . POCT urinalysis dipstick   Possible management options include: LOLO

## 2015-09-19 LAB — URINE CULTURE: Organism ID, Bacteria: NO GROWTH

## 2015-09-21 LAB — NUSWAB VAGINITIS PLUS (VG+)
Atopobium vaginae: HIGH Score — AB
BVAB 2: HIGH {score} — AB
CANDIDA ALBICANS, NAA: NEGATIVE
CHLAMYDIA TRACHOMATIS, NAA: NEGATIVE
Candida glabrata, NAA: NEGATIVE
MEGASPHAERA 1: HIGH {score} — AB
Neisseria gonorrhoeae, NAA: NEGATIVE
Trich vag by NAA: NEGATIVE

## 2015-09-21 LAB — PAP IG W/ RFLX HPV ASCU: PAP Smear Comment: 0

## 2015-09-23 ENCOUNTER — Other Ambulatory Visit: Payer: Self-pay | Admitting: Certified Nurse Midwife

## 2015-09-23 ENCOUNTER — Encounter: Payer: Self-pay | Admitting: *Deleted

## 2015-09-23 ENCOUNTER — Ambulatory Visit (INDEPENDENT_AMBULATORY_CARE_PROVIDER_SITE_OTHER): Payer: BLUE CROSS/BLUE SHIELD | Admitting: Certified Nurse Midwife

## 2015-09-23 ENCOUNTER — Encounter: Payer: Self-pay | Admitting: Certified Nurse Midwife

## 2015-09-23 VITALS — BP 120/71 | HR 87 | Wt 266.0 lb

## 2015-09-23 DIAGNOSIS — Z3046 Encounter for surveillance of implantable subdermal contraceptive: Secondary | ICD-10-CM | POA: Insufficient documentation

## 2015-09-23 DIAGNOSIS — Z3049 Encounter for surveillance of other contraceptives: Secondary | ICD-10-CM | POA: Diagnosis not present

## 2015-09-23 DIAGNOSIS — B9689 Other specified bacterial agents as the cause of diseases classified elsewhere: Secondary | ICD-10-CM

## 2015-09-23 DIAGNOSIS — Z30011 Encounter for initial prescription of contraceptive pills: Secondary | ICD-10-CM

## 2015-09-23 DIAGNOSIS — N760 Acute vaginitis: Principal | ICD-10-CM

## 2015-09-23 MED ORDER — NORETHIN-ETH ESTRAD-FE BIPHAS 1 MG-10 MCG / 10 MCG PO TABS: 1.0000 | ORAL_TABLET | Freq: Every day | ORAL | Status: DC

## 2015-09-23 MED ORDER — METRONIDAZOLE 500 MG PO TABS: 500.0000 mg | ORAL_TABLET | Freq: Two times a day (BID) | ORAL | Status: DC

## 2015-09-23 NOTE — Progress Notes (Signed)
Patient ID: Katherine Preston, female   DOB: 1995-11-27, 20 y.o.   MRN: [id number]  Procedure Note Removal of Nexplanon  Patient had Nexplanon inserted in 2015. Desires removal today d/t irregular bleeding, AUB with clots.   Reviewed risk and benefits of procedure. Alternative options discussed Patient reported understanding and agreed to continue.   The patient's left arm was palpated and the implant device located. The area was prepped with Betadinex3. The distal end of the device was palpated and 1 cc of 1% lidocaine without epinephrine was injected. A 2 mm incision was made. Any fibrotic tissue was carefully dissected away using blunt and/or sharp dissection. Over the tip and the tip was exposed, grasped with forcep and removed intact. Steri-strips and a sterile dressing were applied to the incision.   And a bandage applied and the arm was wrapped with gauze bandage.  The patient tolerated well.  Instructions:  The patient was instructed to remove the dressing in 24 hours and that some bruising is to be expected.  She was advised to use over the counter analgesics as needed for any pain at the site.  She is to keep the area dry for 24 hours and to call if her hand or arm becomes cold, numb, or blue.  Return visit:  Return  PRN Patient plans oral contraceptives (estrogen/progesterone), LOLO sample given.  Orvilla Cornwallachelle Kennan Detter CNM

## 2016-03-09 ENCOUNTER — Other Ambulatory Visit: Payer: Self-pay | Admitting: Family Medicine

## 2016-03-09 DIAGNOSIS — R1084 Generalized abdominal pain: Secondary | ICD-10-CM

## 2016-03-31 ENCOUNTER — Encounter (HOSPITAL_COMMUNITY): Payer: Self-pay | Admitting: Emergency Medicine

## 2016-03-31 ENCOUNTER — Ambulatory Visit (HOSPITAL_COMMUNITY)
Admission: EM | Admit: 2016-03-31 | Discharge: 2016-03-31 | Disposition: A | Discharge status: Home / Self Care | Payer: BLUE CROSS/BLUE SHIELD | Attending: Family Medicine | Admitting: Family Medicine

## 2016-03-31 DIAGNOSIS — Z3490 Encounter for supervision of normal pregnancy, unspecified, unspecified trimester: Secondary | ICD-10-CM

## 2016-03-31 DIAGNOSIS — R109 Unspecified abdominal pain: Secondary | ICD-10-CM

## 2016-03-31 DIAGNOSIS — R1111 Vomiting without nausea: Secondary | ICD-10-CM

## 2016-03-31 DIAGNOSIS — Z3201 Encounter for pregnancy test, result positive: Secondary | ICD-10-CM

## 2016-03-31 DIAGNOSIS — R1033 Periumbilical pain: Secondary | ICD-10-CM | POA: Diagnosis not present

## 2016-03-31 DIAGNOSIS — R197 Diarrhea, unspecified: Secondary | ICD-10-CM

## 2016-03-31 LAB — POCT URINALYSIS DIP (DEVICE)
BILIRUBIN URINE: NEGATIVE
Glucose, UA: NEGATIVE mg/dL
Hgb urine dipstick: NEGATIVE
KETONES UR: NEGATIVE mg/dL
NITRITE: NEGATIVE
PH: 7 (ref 5.0–8.0)
Protein, ur: NEGATIVE mg/dL
Specific Gravity, Urine: 1.02 (ref 1.005–1.030)
Urobilinogen, UA: 0.2 mg/dL (ref 0.0–1.0)

## 2016-03-31 LAB — POCT PREGNANCY, URINE: PREG TEST UR: POSITIVE — AB

## 2016-03-31 MED ORDER — ONDANSETRON 8 MG PO TBDP: 8.0000 mg | ORAL_TABLET | Freq: Three times a day (TID) | ORAL | 0 refills | Status: DC | PRN

## 2016-03-31 MED ORDER — GNP PRENATAL VITAMINS 28-0.8 MG PO TABS: 1.0000 | ORAL_TABLET | Freq: Every day | ORAL | 11 refills | Status: DC

## 2016-03-31 NOTE — ED Triage Notes (Signed)
[facility] a [facility] of lower abdominal pain x 2 weeks. The patient reported cramping in her lower abdominal area as well as some dizziness. The patient denied any dysuria.

## 2016-03-31 NOTE — ED Provider Notes (Signed)
[facility]    CSN: [id number] Arrival date & time: 03/31/16  1743     History   Chief Complaint Chief Complaint  Patient presents with  . Abdominal Pain    HPI Katherine Preston is a [age] y.o. female.   Is a 20 year old woman with lower abdominal pain, seen 3 weeks ago for similar problem. The pain is located primarily in the umbilical area. Associated with some diarrhea, nausea, and vomiting over the last 2 weeks. She's not had a period in 2 months. She's had no known fever.  Patient's at school at [employer].      Past Medical History:  Diagnosis Date  . Allergic rhinitis   . Anxiety    per patient, no diagnosis  . Sleep apnea     Patient Active Problem List   Diagnosis Date Noted  . Encounter for Nexplanon removal 09/23/2015  . Abdominal pain, chronic, right lower quadrant 05/01/2014  . Constipation 05/01/2014  . Abnormal uterine bleeding 08/03/2013  . High BMI 08/03/2013    Past Surgical History:  Procedure Laterality Date  . NO PAST SURGERIES      OB History    Gravida Para Term Preterm AB Living   0 0 0 0 0 0   SAB TAB Ectopic Multiple Live Births   0 0 0 0         Home Medications    Prior to Admission medications   Medication Sig Start Date End Date Taking? Authorizing Provider  ondansetron (ZOFRAN-ODT) 8 MG disintegrating tablet Take 1 tablet (8 mg total) by mouth every 8 (eight) hours as needed for nausea. 03/31/16   Elvina SidleKurt Daishon Chui, MD  Prenatal Vit-Fe Fumarate-FA [facility](GNP PRENATAL VITAMINS) 28-0.8 MG TABS Take 1 tablet by mouth daily. 03/31/16   Elvina SidleKurt Maciel Kegg, MD    Family History Family History  Problem Relation Age of Onset  . Fibroids Mother     Social History Social History  Substance Use Topics  . Smoking status: Never Smoker  . Smokeless tobacco: Never Used  . Alcohol use 0.0 oz/week     Comment: Occassional      Allergies   Pollen extract   Review of Systems Review of Systems    Constitutional: Negative.   HENT: Negative.   Eyes: Negative.   Respiratory: Negative.   Cardiovascular: Negative.   Gastrointestinal: Positive for abdominal pain, diarrhea, nausea and vomiting. Negative for anal bleeding, blood in stool, constipation and rectal pain.  Endocrine: Negative.   Genitourinary: Positive for menstrual problem. Negative for difficulty urinating, dyspareunia, dysuria, enuresis, flank pain and frequency.  Musculoskeletal: Negative.      Physical Exam Triage Vital Signs ED Triage Vitals  Enc Vitals Group     BP 03/31/16 1757 119/76     Pulse Rate 03/31/16 1757 93     Resp 03/31/16 1757 20     Temp 03/31/16 1757 98.4 F (36.9 C)     Temp Source 03/31/16 1757 Oral     SpO2 03/31/16 1757 100 %     Weight --      Height --      Head Circumference --      Peak Flow --      Pain Score 03/31/16 1802 8     Pain Loc --      Pain Edu? --      Excl. in GC? --    No data found.   Updated Vital Signs BP 119/76 (BP Location: Left Arm)  Pulse 93   Temp 98.4 F (36.9 C) (Oral)   Resp 20   LMP 03/01/2016 (Approximate)   SpO2 100%      Physical Exam  Constitutional: She is oriented to person, place, and time. She appears well-developed and well-nourished.  HENT:  Head: Normocephalic and atraumatic.  Right Ear: External ear normal.  Left Ear: External ear normal.  Mouth/Throat: Oropharynx is clear and moist.  Eyes: Conjunctivae and EOM are normal. Pupils are equal, round, and reactive to light.  Neck: Normal range of motion. Neck supple.  Cardiovascular: Normal rate, regular rhythm, normal heart sounds and intact distal pulses.   Pulmonary/Chest: Effort normal and breath sounds normal.  Abdominal: Soft. Bowel sounds are normal. She exhibits no distension and no mass. There is no tenderness. There is no rebound and no guarding.  Musculoskeletal: Normal range of motion.  Neurological: She is alert and oriented to person, place, and time.  Skin: Skin is  warm and dry.  Nursing note and vitals reviewed.    UC Treatments / Results  Labs (all labs ordered are listed, but only abnormal results are displayed) Labs Reviewed  POCT URINALYSIS DIP (DEVICE) - Abnormal; Notable for the following:       Result Value   Leukocytes, UA TRACE (*)    All other components within normal limits  POCT PREGNANCY, URINE - Abnormal; Notable for the following:    Preg Test, Ur POSITIVE (*)    All other components within normal limits    EKG  EKG Interpretation None       Radiology No results found.  Procedures Procedures (including critical care time)  Medications Ordered in UC Medications - No data to display   Initial Impression / Assessment and Plan / UC Course  I have reviewed the triage vital signs and the nursing notes.  Pertinent labs & imaging results that were available during my care of the patient were reviewed by me and considered in my medical decision making (see chart for details).  Clinical Course     Final Clinical Impressions(s) / UC Diagnoses   Final diagnoses:  Pregnancy, unspecified gestational age    New Prescriptions New Prescriptions   ONDANSETRON (ZOFRAN-ODT) 8 MG DISINTEGRATING TABLET    Take 1 tablet (8 mg total) by mouth every 8 (eight) hours as needed for nausea.   PRENATAL VIT-FE FUMARATE-FA (GNP PRENATAL VITAMINS) 28-0.8 MG TABS    Take 1 tablet by mouth daily.     Elvina SidleKurt Namiko Pritts, MD 03/31/16 [phone]

## 2016-04-01 ENCOUNTER — Ambulatory Visit (INDEPENDENT_AMBULATORY_CARE_PROVIDER_SITE_OTHER): Payer: BLUE CROSS/BLUE SHIELD | Admitting: *Deleted

## 2016-04-01 DIAGNOSIS — Z3201 Encounter for pregnancy test, result positive: Secondary | ICD-10-CM

## 2016-04-01 DIAGNOSIS — N912 Amenorrhea, unspecified: Secondary | ICD-10-CM

## 2016-04-01 LAB — POCT URINE PREGNANCY: Preg Test, Ur: POSITIVE — AB

## 2016-04-01 MED ORDER — VITAFOL ULTRA 29-0.6-0.4-200 MG PO CAPS: 1.0000 | ORAL_CAPSULE | Freq: Every day | ORAL | 11 refills | Status: DC

## 2016-04-01 NOTE — Progress Notes (Signed)
Patient is 3945w4d with EDD 11/28/2016 using the LMP. Patient is experiencing nausea. OTC recommendations given, discussed grazing techniques, when to go to [facility]. Rx for prenatal Vitamins sent to pharmacy.

## 2016-04-02 ENCOUNTER — Telehealth (HOSPITAL_COMMUNITY): Payer: Self-pay | Admitting: Family Medicine

## 2016-04-02 MED ORDER — ONDANSETRON 8 MG PO TBDP: 8.0000 mg | ORAL_TABLET | Freq: Three times a day (TID) | ORAL | 0 refills | Status: DC | PRN

## 2016-04-02 MED ORDER — GNP PRENATAL VITAMINS 28-0.8 MG PO TABS: 1.0000 | ORAL_TABLET | Freq: Every day | ORAL | 11 refills | Status: DC

## 2016-04-02 NOTE — Telephone Encounter (Signed)
Pt sts that she lost her medications.

## 2016-04-05 ENCOUNTER — Other Ambulatory Visit: Payer: BLUE CROSS/BLUE SHIELD

## 2016-04-14 ENCOUNTER — Ambulatory Visit (INDEPENDENT_AMBULATORY_CARE_PROVIDER_SITE_OTHER): Payer: BLUE CROSS/BLUE SHIELD | Admitting: Family Medicine

## 2016-04-14 ENCOUNTER — Encounter: Payer: Self-pay | Admitting: Family Medicine

## 2016-04-14 VITALS — BP 110/70 | HR 86 | Temp 98.7°F | Resp 16 | Ht 63.0 in | Wt 260.6 lb

## 2016-04-14 DIAGNOSIS — Z113 Encounter for screening for infections with a predominantly sexual mode of transmission: Secondary | ICD-10-CM

## 2016-04-14 DIAGNOSIS — O210 Mild hyperemesis gravidarum: Secondary | ICD-10-CM

## 2016-04-14 NOTE — Patient Instructions (Addendum)

## 2016-04-14 NOTE — Progress Notes (Signed)
No chief complaint on file.   HPI   Patient's last menstrual period was 02/22/2016. She is currently pregnant and is [redacted] weeks pregnant She reports that she stopped taking the pill randomly lo lo estrin She reports that she took a pregnancy test at home and urgent care  [facility]  She is taking a prenatal vitamin and has some morning sickness but is able to eat and drink. + nausea No vaginal bleeding No vaginal discharge  Component     Latest Ref Rng & Units 04/01/2016  Preg Test, Ur     Negative Positive (A)    Past Medical History:  Diagnosis Date  . Allergic rhinitis   . Anxiety    per patient, no diagnosis  . Sleep apnea     Current Outpatient Prescriptions  Medication Sig Dispense Refill  . ondansetron (ZOFRAN ODT) 8 MG disintegrating tablet Take 1 tablet (8 mg total) by mouth every 8 (eight) hours as needed for nausea or vomiting. 15 tablet 0  . ondansetron (ZOFRAN-ODT) 8 MG disintegrating tablet Take 1 tablet (8 mg total) by mouth every 8 (eight) hours as needed for nausea. 15 tablet 0  . Prenat-Fe Poly-Methfol-FA-DHA (VITAFOL ULTRA) 29-0.6-0.4-200 MG CAPS Take 1 capsule by mouth daily. 30 capsule 11  . Prenatal Vit-Fe Fumarate-FA (GNP PRENATAL VITAMINS) 28-0.8 MG TABS Take 1 tablet by mouth daily. 30 tablet 11  . Prenatal Vit-Fe Fumarate-FA (GNP PRENATAL VITAMINS) 28-0.8 MG TABS Take 1 tablet by mouth daily. 30 tablet 11   No current facility-administered medications for this visit.     Allergies:  Allergies  Allergen Reactions  . Pollen Extract     Past Surgical History:  Procedure Laterality Date  . NO PAST SURGERIES      Social History   Social History  . Marital status: Single    Spouse name: N/A  . Number of children: 0  . Years of education: N/A   Occupational History  .      [employer]   Social History Main Topics  . Smoking status: Never Smoker  . Smokeless tobacco: Never Used   . Alcohol use 0.0 oz/week     Comment: Occassional   . Drug use: No  . Sexual activity: Yes    Partners: Male    Birth control/ protection: Condom, Implant   Other Topics Concern  . None   Social History Narrative  . None    ROS  Objective: Vitals:   04/14/16 1509  BP: 110/70  Pulse: 86  Resp: 16  Temp: 98.7 F (37.1 C)  TempSrc: Oral  SpO2: 100%  Weight: 260 lb 9.6 oz (118.2 kg)  Height: 5\' 3"  (1.6 m)    Physical Exam  Constitutional: She is oriented to person, place, and time. She appears well-developed and well-nourished.  HENT:  Head: Normocephalic and atraumatic.  Eyes: Conjunctivae and EOM are normal.  Cardiovascular: Normal rate, regular rhythm and normal heart sounds.   No murmur heard. Pulmonary/Chest: Effort normal and breath sounds normal. No respiratory distress. She has no wheezes.  Neurological: She is alert and oriented to person, place, and time.  Skin: Skin is warm. Capillary refill takes less than 2 seconds. No erythema.    Assessment and Plan Diagnoses and all orders for this visit:  Screen for STD (sexually transmitted disease)- consent given verbally Will screen today Follow up with OB for other screening -     RPR -  Hepatitis B surface antibody -     HIV antibody -     GC/Chlamydia Probe Amp  Morning sickness- advised ginger chews, b complex vitamin and smaller meals     Katherine Preston

## 2016-04-21 ENCOUNTER — Telehealth: Payer: Self-pay | Admitting: Family Medicine

## 2016-04-21 LAB — RPR

## 2016-04-21 LAB — HIV ANTIBODY (ROUTINE TESTING W REFLEX): HIV: NONREACTIVE

## 2016-04-21 LAB — GC/CHLAMYDIA PROBE AMP
CT PROBE, AMP APTIMA: NOT DETECTED
GC Probe RNA: NOT DETECTED

## 2016-04-21 LAB — HEPATITIS B SURFACE ANTIBODY, QUANTITATIVE

## 2016-04-21 NOTE — Telephone Encounter (Signed)
Pt called about labs I gave her normal results per Dr Creta LevinStallings

## 2016-04-22 ENCOUNTER — Inpatient Hospital Stay (HOSPITAL_COMMUNITY)
Admission: AD | Admit: 2016-04-22 | Discharge: 2016-04-22 | Disposition: A | Discharge status: Home / Self Care | Payer: BLUE CROSS/BLUE SHIELD | Source: Ambulatory Visit | Attending: Obstetrics and Gynecology | Admitting: Obstetrics and Gynecology

## 2016-04-22 ENCOUNTER — Inpatient Hospital Stay (HOSPITAL_COMMUNITY): Payer: BLUE CROSS/BLUE SHIELD

## 2016-04-22 ENCOUNTER — Encounter (HOSPITAL_COMMUNITY): Payer: Self-pay | Admitting: *Deleted

## 2016-04-22 DIAGNOSIS — Z3A08 8 weeks gestation of pregnancy: Secondary | ICD-10-CM | POA: Insufficient documentation

## 2016-04-22 DIAGNOSIS — R519 Headache, unspecified: Secondary | ICD-10-CM

## 2016-04-22 DIAGNOSIS — R51 Headache: Secondary | ICD-10-CM | POA: Diagnosis present

## 2016-04-22 DIAGNOSIS — O219 Vomiting of pregnancy, unspecified: Secondary | ICD-10-CM

## 2016-04-22 DIAGNOSIS — O208 Other hemorrhage in early pregnancy: Secondary | ICD-10-CM | POA: Diagnosis not present

## 2016-04-22 DIAGNOSIS — R109 Unspecified abdominal pain: Secondary | ICD-10-CM | POA: Diagnosis not present

## 2016-04-22 DIAGNOSIS — O418X10 Other specified disorders of amniotic fluid and membranes, first trimester, not applicable or unspecified: Secondary | ICD-10-CM

## 2016-04-22 DIAGNOSIS — Z3491 Encounter for supervision of normal pregnancy, unspecified, first trimester: Secondary | ICD-10-CM

## 2016-04-22 DIAGNOSIS — O26899 Other specified pregnancy related conditions, unspecified trimester: Secondary | ICD-10-CM

## 2016-04-22 DIAGNOSIS — O26891 Other specified pregnancy related conditions, first trimester: Secondary | ICD-10-CM | POA: Insufficient documentation

## 2016-04-22 DIAGNOSIS — Z79899 Other long term (current) drug therapy: Secondary | ICD-10-CM | POA: Insufficient documentation

## 2016-04-22 DIAGNOSIS — O468X1 Other antepartum hemorrhage, first trimester: Secondary | ICD-10-CM

## 2016-04-22 LAB — CBC
HEMATOCRIT: 35.6 % — AB (ref 36.0–46.0)
Hemoglobin: 12 g/dL (ref 12.0–15.0)
MCH: 25.2 pg — ABNORMAL LOW (ref 26.0–34.0)
MCHC: 33.7 g/dL (ref 30.0–36.0)
MCV: 74.8 fL — ABNORMAL LOW (ref 78.0–100.0)
Platelets: 278 10*3/uL (ref 150–400)
RBC: 4.76 MIL/uL (ref 3.87–5.11)
RDW: 15.6 % — AB (ref 11.5–15.5)
WBC: 9.8 10*3/uL (ref 4.0–10.5)

## 2016-04-22 LAB — URINE MICROSCOPIC-ADD ON: RBC / HPF: NONE SEEN RBC/hpf (ref 0–5)

## 2016-04-22 LAB — HCG, QUANTITATIVE, PREGNANCY: HCG, BETA CHAIN, QUANT, S: 83008 m[IU]/mL — AB (ref <5)

## 2016-04-22 LAB — URINALYSIS, ROUTINE W REFLEX MICROSCOPIC
BILIRUBIN URINE: NEGATIVE
GLUCOSE, UA: NEGATIVE mg/dL
HGB URINE DIPSTICK: NEGATIVE
Ketones, ur: NEGATIVE mg/dL
Leukocytes, UA: NEGATIVE
Nitrite: NEGATIVE
PROTEIN: 30 mg/dL — AB
SPECIFIC GRAVITY, URINE: 1.015 (ref 1.005–1.030)
pH: 7.5 (ref 5.0–8.0)

## 2016-04-22 LAB — ABO/RH: ABO/RH(D): B POS

## 2016-04-22 MED ORDER — PROMETHAZINE HCL 25 MG PO TABS: 25.0000 mg | ORAL_TABLET | Freq: Four times a day (QID) | ORAL | 0 refills | Status: DC | PRN

## 2016-04-22 MED ORDER — DEXAMETHASONE SODIUM PHOSPHATE 10 MG/ML IJ SOLN
10.0000 mg | Freq: Once | INTRAMUSCULAR | Status: AC
  Administered 2016-04-22: 10 mg via INTRAVENOUS
  Filled 2016-04-22: qty 1

## 2016-04-22 MED ORDER — METOCLOPRAMIDE HCL 5 MG/ML IJ SOLN
10.0000 mg | Freq: Once | INTRAMUSCULAR | Status: AC
  Administered 2016-04-22: 10 mg via INTRAVENOUS
  Filled 2016-04-22: qty 2

## 2016-04-22 MED ORDER — BUTALBITAL-APAP-CAFFEINE 50-325-40 MG PO TABS: 2.0000 | ORAL_TABLET | Freq: Four times a day (QID) | ORAL | 0 refills | Status: DC | PRN

## 2016-04-22 MED ORDER — DIPHENHYDRAMINE HCL 50 MG/ML IJ SOLN
25.0000 mg | Freq: Once | INTRAMUSCULAR | Status: AC
  Administered 2016-04-22: 25 mg via INTRAVENOUS
  Filled 2016-04-22: qty 1

## 2016-04-22 MED ORDER — SODIUM CHLORIDE 0.9 % IV SOLN
INTRAVENOUS | Status: DC
  Administered 2016-04-22: 11:00:00 via INTRAVENOUS

## 2016-04-22 NOTE — MAU Provider Note (Signed)
History     CSN: [id number]  Arrival date and time: 04/22/16 [id number]   First Provider Initiated Contact with Patient 04/22/16 1031      Chief Complaint  Patient presents with  . Headache  . Abdominal Pain  . Emesis   HPI  Rod CanYvonne Wallis is a 20 y.o. G1P0000 at 1931w4d by LMP who presents with abdominal pain, headache, & n/v. Reports lower abdominal pain since last night. Pain comes & goes & is worse in the LLQ. Rates pain 9/10. Has not treated. Nothing makes better. Vomiting makes worse.  Reports history of migraine headaches; normally treats successfully with ibuprofen & excedrin migraine. Current headache began this morning. Worse on right side. Rates pain 7/10. Has not treated. Movement makes pain worse. Nothing makes pain better.  Nausea & vomiting throughout pregnancy. Has vomited twice today. Has not treated nausea.  Denies fever/chills, diarrhea, dysuria, vaginal discharge, heartburn, or vaginal bleeding. [facility].   OB History    Gravida Para Term Preterm AB Living   1 0 0 0 0 0   SAB TAB Ectopic Multiple Live Births   0 0 0 0        Past Medical History:  Diagnosis Date  . Allergic rhinitis   . Anxiety    per patient, no diagnosis  . Sleep apnea     Past Surgical History:  Procedure Laterality Date  . NO PAST SURGERIES      Family History  Problem Relation Age of Onset  . Fibroids Mother     Social History  Substance Use Topics  . Smoking status: Never Smoker  . Smokeless tobacco: Never Used  . Alcohol use 0.0 oz/week     Comment: Occassional -not with pregnancy    Allergies:  Allergies  Allergen Reactions  . Pollen Extract     Prescriptions Prior to Admission  Medication Sig Dispense Refill Last Dose  . ondansetron (ZOFRAN ODT) 8 MG disintegrating tablet Take 1 tablet (8 mg total) by mouth every 8 (eight) hours as needed for nausea or vomiting. 15 tablet 0 04/22/2016 at Unknown time  .  Prenat-Fe Poly-Methfol-FA-DHA (VITAFOL ULTRA) 29-0.6-0.4-200 MG CAPS Take 1 capsule by mouth daily. 30 capsule 11 04/21/2016 at Unknown time  . ondansetron (ZOFRAN-ODT) 8 MG disintegrating tablet Take 1 tablet (8 mg total) by mouth every 8 (eight) hours as needed for nausea. (Patient not taking: Reported on 04/22/2016) 15 tablet 0 Not Taking at Unknown time  . Prenatal Vit-Fe Fumarate-FA (GNP PRENATAL VITAMINS) 28-0.8 MG TABS Take 1 tablet by mouth daily. (Patient not taking: Reported on 04/22/2016) 30 tablet 11 Not Taking at Unknown time  . Prenatal Vit-Fe Fumarate-FA (GNP PRENATAL VITAMINS) 28-0.8 MG TABS Take 1 tablet by mouth daily. (Patient not taking: Reported on 04/22/2016) 30 tablet 11 Not Taking at Unknown time    Review of Systems  Constitutional: Negative for chills and fever.  Eyes: Negative for blurred vision, double vision and photophobia.  Gastrointestinal: Positive for abdominal pain, constipation, nausea and vomiting. Negative for diarrhea.  Genitourinary: Negative.   Neurological: Positive for headaches. Negative for dizziness.   Physical Exam   Blood pressure 103/70, pulse 81, temperature 98.8 F (37.1 C), resp. rate 18, height 5\' 2"  (1.575 m), weight 261 lb 3.2 oz (118.5 kg), last menstrual period 02/22/2016.  Physical Exam  Nursing note and vitals reviewed. Constitutional: She is oriented to person, place, and time. She appears well-developed and well-nourished. No distress.  HENT:  Head: Normocephalic and atraumatic.  Eyes: Conjunctivae are normal. Right eye exhibits no discharge. Left eye exhibits no discharge. No scleral icterus.  Neck: Normal range of motion.  Cardiovascular: Normal rate, regular rhythm and normal heart sounds.   No murmur heard. Respiratory: Effort normal and breath sounds normal. No respiratory distress. She has no wheezes.  GI: Soft. There is no tenderness.  Neurological: She is alert and oriented to person, place, and time.  Skin: Skin is  warm and dry. She is not diaphoretic.  Psychiatric: She has a normal mood and affect. Her behavior is normal. Judgment and thought content normal.    MAU Course  Procedures Results for orders placed or performed during the hospital encounter of 04/22/16 (from the past 24 hour(s))  Urinalysis, Routine w reflex microscopic ([facility])     Status: Abnormal   Collection Time: 04/22/16 10:23 AM  Result Value Ref Range   Color, Urine YELLOW YELLOW   APPearance CLEAR CLEAR   Specific Gravity, Urine 1.015 1.005 - 1.030   pH 7.5 5.0 - 8.0   Glucose, UA NEGATIVE NEGATIVE mg/dL   Hgb urine dipstick NEGATIVE NEGATIVE   Bilirubin Urine NEGATIVE NEGATIVE   Ketones, ur NEGATIVE NEGATIVE mg/dL   Protein, ur 30 (A) NEGATIVE mg/dL   Nitrite NEGATIVE NEGATIVE   Leukocytes, UA NEGATIVE NEGATIVE  Urine microscopic-add on     Status: Abnormal   Collection Time: 04/22/16 10:23 AM  Result Value Ref Range   Squamous Epithelial / LPF 0-5 (A) NONE SEEN   WBC, UA 0-5 0 - 5 WBC/hpf   RBC / HPF NONE SEEN 0 - 5 RBC/hpf   Bacteria, UA RARE (A) NONE SEEN  CBC     Status: Abnormal   Collection Time: 04/22/16 11:32 AM  Result Value Ref Range   WBC 9.8 4.0 - 10.5 K/uL   RBC 4.76 3.87 - 5.11 MIL/uL   Hemoglobin 12.0 12.0 - 15.0 g/dL   HCT 16.135.6 (L) 09.636.0 - 04.546.0 %   MCV 74.8 (L) 78.0 - 100.0 fL   MCH 25.2 (L) 26.0 - 34.0 pg   MCHC 33.7 30.0 - 36.0 g/dL   RDW 40.915.6 (H) 81.111.5 - 91.415.5 %   Platelets 278 150 - 400 K/uL  ABO/Rh     Status: None   Collection Time: 04/22/16 11:32 AM  Result Value Ref Range   ABO/RH(D) B POS   hCG, quantitative, pregnancy     Status: Abnormal   Collection Time: 04/22/16 11:32 AM  Result Value Ref Range   hCG, Beta Chain, Quant, S 83,008 (H) <5 mIU/mL   [place] Ob Comp Less 14 Wks  Result Date: 04/22/2016 CLINICAL DATA:  Patient with left lower quadrant abdominal pain and cramping. EXAM: OBSTETRIC <14 WK US AND TRANSVAGINAL OB US TECHNIQUE: Both transabdominal and transvaginal  ultrasound examinations were performed for complete evaluation of the gestation as well as the maternal uterus, adnexal regions, and pelvic cul-de-sac. Transvaginal technique was performed to assess early pregnancy. COMPARISON:  None. FINDINGS: Intrauterine gestational sac: Single Yolk sac:  Visualized. Embryo:  Visualized. Cardiac Activity: Visualized. Heart Rate: 164  bpm CRL:  25.0  mm   9 w   1 d                  US EDC: 11/24/2016 Subchorionic hemorrhage:  Moderate Maternal uterus/adnexae: Probable corpus luteum within the left ovary. Normal right ovary. Small amount of free fluid in the pelvis. IMPRESSION: Single live intrauterine gestation.  Moderate subchorionic hemorrhage. Electronically Signed   By: Annia Belt M.D.   On: 04/22/2016 13:04   US Ob Transvaginal  Result Date: 04/22/2016 CLINICAL DATA:  Patient with left lower quadrant abdominal pain and cramping. EXAM: OBSTETRIC <14 WK [place] AND TRANSVAGINAL OB US TECHNIQUE: Both transabdominal and transvaginal ultrasound examinations were performed for complete evaluation of the gestation as well as the maternal uterus, adnexal regions, and pelvic cul-de-sac. Transvaginal technique was performed to assess early pregnancy. COMPARISON:  None. FINDINGS: Intrauterine gestational sac: Single Yolk sac:  Visualized. Embryo:  Visualized. Cardiac Activity: Visualized. Heart Rate: 164  bpm CRL:  25.0  mm   9 w   1 d                  [place] EDC: 11/24/2016 Subchorionic hemorrhage:  Moderate Maternal uterus/adnexae: Probable corpus luteum within the left ovary. Normal right ovary. Small amount of free fluid in the pelvis. IMPRESSION: Single live intrauterine gestation. Moderate subchorionic hemorrhage. Electronically Signed   By: Annia Belt M.D.   On: 04/22/2016 13:04     MDM +UPT UA, CBC, ABO/Rh, quant hCG, HIV, and [place] today to rule out ectopic pregnancy -- GC/CT collected at urgent care last week & were negative, will defer at today's visit Headache cocktail --  decadron, benadryl, reglan IV Ultrasound shows SIUP with cardiac activity and small SCH Pt reports resolution of headache & improvement of nausea Assessment and Plan  A: 1. Normal IUP (intrauterine pregnancy) on prenatal ultrasound, first trimester   2. Abdominal pain affecting pregnancy   3. Nausea and vomiting during pregnancy prior to [redacted] weeks gestation   4. Acute nonintractable headache, unspecified headache type   5. Subchorionic hematoma in first trimester, single or unspecified fetus    P: Discharge home Rx phenergan & fioricet Discussed treatment of headaches at home Keep ob appt Discussed reasons to return to MAU  Judeth Horn 04/22/2016, 12:00 PM

## 2016-04-22 NOTE — MAU Note (Signed)
Pt stated she started having some abd cramping last night. Woke up with a headache/migraine this morning. Having nausea and vomiting with it. Has not taken anything for headache.

## 2016-04-22 NOTE — MAU Note (Cosign Needed)
MAU HISTORY AND PHYSICAL  Chief Complaint:  Headache; Abdominal Pain; and Emesis   Katherine Preston is a 20 y.o.  G1P0000  at [redacted]w[redacted]d presenting for Headache; Abdominal Pain; and Emesis . Patient states she has been having intermittent abdominal cramps since last night with 9/10 pain when most severe, and the pain is worst in the LLQ. She has also had two episodes of clear emesis and diarrhea twice today. She has periodic constipation with hard stools and straining. She has also had a R-sided headache since 6 am this morning that was 10/10 in severity but decreased to 7/10 at presentation without pain medicines. She has had migraines in the past, usually in the occipital region. She had some associated blurry vision on the R side this morning, but it improved during her stay. She denies syncope but did feel dizzy when the headache started this morning. Moving around makes her headache worse. She has had no issues with her pregnancy and denies vaginal bleeding and discharge. She plans for [facility] at [facility] and is taking a prenatal vitamin.  Past Medical History:  Diagnosis Date  . Allergic rhinitis   . Anxiety    per patient, no diagnosis  . Sleep apnea     Past Surgical History:  Procedure Laterality Date  . NO PAST SURGERIES      Family History  Problem Relation Age of Onset  . Fibroids Mother     Social History  Substance Use Topics  . Smoking status: Never Smoker  . Smokeless tobacco: Never Used  . Alcohol use 0.0 oz/week     Comment: Occassional -not with pregnancy    Allergies  Allergen Reactions  . Pollen Extract     Prescriptions Prior to Admission  Medication Sig Dispense Refill Last Dose  . ondansetron (ZOFRAN ODT) 8 MG disintegrating tablet Take 1 tablet (8 mg total) by mouth every 8 (eight) hours as needed for nausea or vomiting. 15 tablet 0 04/22/2016 at Unknown time  . Prenat-Fe Poly-Methfol-FA-DHA (VITAFOL ULTRA) 29-0.6-0.4-200 MG CAPS Take 1  capsule by mouth daily. 30 capsule 11 04/21/2016 at Unknown time  . ondansetron (ZOFRAN-ODT) 8 MG disintegrating tablet Take 1 tablet (8 mg total) by mouth every 8 (eight) hours as needed for nausea. (Patient not taking: Reported on 04/22/2016) 15 tablet 0 Not Taking at Unknown time  . Prenatal Vit-Fe Fumarate-FA (GNP PRENATAL VITAMINS) 28-0.8 MG TABS Take 1 tablet by mouth daily. (Patient not taking: Reported on 04/22/2016) 30 tablet 11 Not Taking at Unknown time  . Prenatal Vit-Fe Fumarate-FA (GNP PRENATAL VITAMINS) 28-0.8 MG TABS Take 1 tablet by mouth daily. (Patient not taking: Reported on 04/22/2016) 30 tablet 11 Not Taking at Unknown time    Review of Systems - Negative except for what is mentioned in HPI.  Physical Exam  Blood pressure 103/70, pulse 81, temperature 98.8 F (37.1 C), resp. rate 18, height 5\' 2"  (1.575 m), weight 118.5 kg (261 lb 3.2 oz), last menstrual period 02/22/2016. GEN: NAD, alert, lying in bed with lights off HEENT: NCAT, MMM NECK: No lymphadenopathy or thyromegaly; no nuchal rigidity PULM: Normal WOB on RA; lungs CTAB CV: RRR, no m/r/g ABD: NABS, diffusely tender to palpation, no rebound or guarding SKIN: warm and dry NEURO: PERRL, EOMI, face symmetric with SLTI, tongue midline, moving all extremities spontaneously, 5/5 strength in BUEs and BLEs PSYCH: normal mood and affect   Labs: Results for orders placed or performed during the hospital encounter of 04/22/16 (from the past  24 hour(s))  Urinalysis, Routine w reflex microscopic (not at [facility])   Collection Time: 04/22/16 10:23 AM  Result Value Ref Range   Color, Urine YELLOW YELLOW   APPearance CLEAR CLEAR   Specific Gravity, Urine 1.015 1.005 - 1.030   pH 7.5 5.0 - 8.0   Glucose, UA NEGATIVE NEGATIVE mg/dL   Hgb urine dipstick NEGATIVE NEGATIVE   Bilirubin Urine NEGATIVE NEGATIVE   Ketones, ur NEGATIVE NEGATIVE mg/dL   Protein, ur 30 (A) NEGATIVE mg/dL   Nitrite NEGATIVE NEGATIVE   Leukocytes, UA  NEGATIVE NEGATIVE  Urine microscopic-add on   Collection Time: 04/22/16 10:23 AM  Result Value Ref Range   Squamous Epithelial / LPF 0-5 (A) NONE SEEN   WBC, UA 0-5 0 - 5 WBC/hpf   RBC / HPF NONE SEEN 0 - 5 RBC/hpf   Bacteria, UA RARE (A) NONE SEEN  CBC   Collection Time: 04/22/16 11:32 AM  Result Value Ref Range   WBC 9.8 4.0 - 10.5 K/uL   RBC 4.76 3.87 - 5.11 MIL/uL   Hemoglobin 12.0 12.0 - 15.0 g/dL   HCT 16.135.6 (L) 09.636.0 - 04.546.0 %   MCV 74.8 (L) 78.0 - 100.0 fL   MCH 25.2 (L) 26.0 - 34.0 pg   MCHC 33.7 30.0 - 36.0 g/dL   RDW 40.915.6 (H) 81.111.5 - 91.415.5 %   Platelets 278 150 - 400 K/uL  hCG, quantitative, pregnancy   Collection Time: 04/22/16 11:32 AM  Result Value Ref Range   hCG, Beta Chain, Quant, S 83,008 (H) <5 mIU/mL  ABO/Rh   Collection Time: 04/22/16 11:32 AM  Result Value Ref Range   ABO/RH(D) B POS     RPR, HIV ab, GC/Chlamydia, and HBsAg all negative 04/14/16  Imaging Studies:  [place] Ob Comp Less 14 Wks  Result Date: 04/22/2016 CLINICAL DATA:  Patient with left lower quadrant abdominal pain and cramping. EXAM: OBSTETRIC <14 WK US AND TRANSVAGINAL OB US TECHNIQUE: Both transabdominal and transvaginal ultrasound examinations were performed for complete evaluation of the gestation as well as the maternal uterus, adnexal regions, and pelvic cul-de-sac. Transvaginal technique was performed to assess early pregnancy. COMPARISON:  None. FINDINGS: Intrauterine gestational sac: Single Yolk sac:  Visualized. Embryo:  Visualized. Cardiac Activity: Visualized. Heart Rate: 164  bpm CRL:  25.0  mm   9 w   1 d                  US EDC: 11/24/2016 Subchorionic hemorrhage:  Moderate Maternal uterus/adnexae: Probable corpus luteum within the left ovary. Normal right ovary. Small amount of free fluid in the pelvis. IMPRESSION: Single live intrauterine gestation. Moderate subchorionic hemorrhage. Electronically Signed   By: Annia Beltrew  Davis M.D.   On: 04/22/2016 13:04   [place] Ob Transvaginal  Result Date:  04/22/2016 CLINICAL DATA:  Patient with left lower quadrant abdominal pain and cramping. EXAM: OBSTETRIC <14 WK US AND TRANSVAGINAL OB US TECHNIQUE: Both transabdominal and transvaginal ultrasound examinations were performed for complete evaluation of the gestation as well as the maternal uterus, adnexal regions, and pelvic cul-de-sac. Transvaginal technique was performed to assess early pregnancy. COMPARISON:  None. FINDINGS: Intrauterine gestational sac: Single Yolk sac:  Visualized. Embryo:  Visualized. Cardiac Activity: Visualized. Heart Rate: 164  bpm CRL:  25.0  mm   9 w   1 d                  US EDC: 11/24/2016 Subchorionic hemorrhage:  Moderate Maternal uterus/adnexae: Probable corpus  luteum within the left ovary. Normal right ovary. Small amount of free fluid in the pelvis. IMPRESSION: Single live intrauterine gestation. Moderate subchorionic hemorrhage. Electronically Signed   By: Annia Beltrew  Davis M.D.   On: 04/22/2016 13:04    Assessment: Rod CanYvonne Sar is  20 y.o. G1P0000 at 2967w4d presents in stable condition with Headache; Abdominal Pain; and Emesis most likely caused by migraine headache and normal emesis of pregnancy with resolution of her pain at the time of discharge.    Plan: Quantitative b-HCG was WNL for EGA of 688w1d, and US confirmed live IUP with moderate subchorionic hemorrhage. CBC/ UA unremarkable; ABO/Rh blood type B+. We discussed with patient the possibility of first trimester bleeding and risk of miscarriage as well as return precautions for heavy bleeding and cramping; also stressed importance of PO intake in light of recent nausea/vomiting. Discharged with Phenergan for nausea/vomiting and short course of Fioricet for headache PRN. [facility]  Ennis FortsJessica Josealfredo Adkins, medical student [facility] [employer] [facility] 04/22/2016

## 2016-04-22 NOTE — MAU Note (Signed)
Urine in lab 

## 2016-04-22 NOTE — Discharge Instructions (Signed)
Abdominal Pain During Pregnancy Belly (abdominal) pain is common during pregnancy. Most of the time, it is not a serious problem. Other times, it can be a sign that something is wrong with the pregnancy. Always tell your doctor if you have belly pain. Follow these instructions at home: Monitor your belly pain for any changes. The following actions may help you feel better:  Do not have sex (intercourse) or put anything in your vagina until you feel better.  Rest until your pain stops.  Drink clear fluids if you feel sick to your stomach (nauseous). Do not eat solid food until you feel better.  Only take medicine as told by your doctor.  Keep all doctor visits as told. Get help right away if:  You are bleeding, leaking fluid, or pieces of tissue come out of your vagina.  You have more pain or cramping.  You keep throwing up (vomiting).  You have pain when you pee (urinate) or have blood in your pee.  You have a fever.  You do not feel your baby moving as much.  You feel very weak or feel like passing out.  You have trouble breathing, with or without belly pain.  You have a very bad headache and belly pain.  You have fluid leaking from your vagina and belly pain.  You keep having watery poop (diarrhea).  Your belly pain does not go away after resting, or the pain gets worse. This information is not intended to replace advice given to you by your health care provider. Make sure you discuss any questions you have with your health care provider. Document Released: 04/28/2009 Document Revised: 12/17/2015 Document Reviewed: 12/07/2012 Elsevier Interactive Patient Education  2017 [employer]  General Headache Without Cause Introduction A headache is pain or discomfort felt around the head or neck area. There are many causes and types of headaches. In some cases, the cause may not be found. Follow these instructions at home: Managing pain  Take over-the-counter and  prescription medicines only as told by your doctor.  Lie down in a dark, quiet room when you have a headache.  If directed, apply ice to the head and neck area:  Put ice in a plastic bag.  Place a towel between your skin and the bag.  Leave the ice on for 20 minutes, 2-3 times per day.  Use a heating pad or hot shower to apply heat to the head and neck area as told by your doctor.  Keep lights dim if bright lights bother you or make your headaches worse. Eating and drinking  Eat meals on a regular schedule.  Lessen how much alcohol you drink.  Lessen how much caffeine you drink, or stop drinking caffeine. General instructions  Keep all follow-up visits as told by your doctor. This is important.  Keep a journal to find out if certain things bring on headaches. For example, write down:  What you eat and drink.  How much sleep you get.  Any change to your diet or medicines.  Relax by getting a massage or doing other relaxing activities.  Lessen stress.  Sit up straight. Do not tighten (tense) your muscles.  Do not use tobacco products. This includes cigarettes, chewing tobacco, or e-cigarettes. If you need help quitting, ask your doctor.  Exercise regularly as told by your doctor.  Get enough sleep. This often means 7-9 hours of sleep. Contact a doctor if:  Your symptoms are not helped by medicine.  You have a headache  that feels different than the other headaches.  You feel sick to your stomach (nauseous) or you throw up (vomit).  You have a fever. Get help right away if:  Your headache becomes really bad.  You keep throwing up.  You have a stiff neck.  You have trouble seeing.  You have trouble speaking.  You have pain in the eye or ear.  Your muscles are weak or you lose muscle control.  You lose your balance or have trouble walking.  You feel like you will pass out (faint) or you pass out.  You have confusion. This information is not  intended to replace advice given to you by your health care provider. Make sure you discuss any questions you have with your health care provider. Document Released: 02/17/2008 Document Revised: 10/16/2015 Document Reviewed: 09/02/2014  2017 [employer]  Morning Sickness Morning sickness is when you feel sick to your stomach (nauseous) during pregnancy. This nauseous feeling may or may not come with vomiting. It often occurs in the morning but can be a problem any time of day. Morning sickness is most common during the first trimester, but it may continue throughout pregnancy. While morning sickness is unpleasant, it is usually harmless unless you develop severe and continual vomiting (hyperemesis gravidarum). This condition requires more intense treatment. What are the causes? The cause of morning sickness is not completely known but seems to be related to normal hormonal changes that occur in pregnancy. What increases the risk? You are at greater risk if you:  Experienced nausea or vomiting before your pregnancy.  Had morning sickness during a previous pregnancy.  Are pregnant with more than one baby, such as twins. How is this treated? Do not use any medicines (prescription, over-the-counter, or herbal) for morning sickness without first talking to your health care provider. Your health care provider may prescribe or recommend:  Vitamin B6 supplements.  Anti-nausea medicines.  The herbal medicine ginger. Follow these instructions at home:  Only take over-the-counter or prescription medicines as directed by your health care provider.  Taking multivitamins before getting pregnant can prevent or decrease the severity of morning sickness in most women.  Eat a piece of dry toast or unsalted crackers before getting out of bed in the morning.  Eat five or six small meals a day.  Eat dry and bland foods (rice, baked potato). Foods high in carbohydrates are often helpful.  Do not drink  liquids with your meals. Drink liquids between meals.  Avoid greasy, fatty, and spicy foods.  Get someone to cook for you if the smell of any food causes nausea and vomiting.  If you feel nauseous after taking prenatal vitamins, take the vitamins at night or with a snack.  Snack on protein foods (nuts, yogurt, cheese) between meals if you are hungry.  Eat unsweetened gelatins for desserts.  Wearing an acupressure wristband (worn for sea sickness) may be helpful.  Acupuncture may be helpful.  Do not smoke.  Get a humidifier to keep the air in your house free of odors.  Get plenty of fresh air. Contact a health care provider if:  Your home remedies are not working, and you need medicine.  You feel dizzy or lightheaded.  You are losing weight. Get help right away if:  You have persistent and uncontrolled nausea and vomiting.  You pass out (faint). This information is not intended to replace advice given to you by your health care provider. Make sure you discuss any questions you have with  your health care provider. Document Released: 07/01/2006 Document Revised: 10/16/2015 Document Reviewed: 10/25/2012 [employer] Interactive Patient Education  2017 [employer]

## 2016-04-27 ENCOUNTER — Telehealth: Payer: Self-pay

## 2016-04-27 NOTE — Telephone Encounter (Signed)
Patient called in with questions related to her pregnancy dating.

## 2016-04-30 ENCOUNTER — Telehealth: Payer: Self-pay

## 2016-04-30 NOTE — Telephone Encounter (Signed)
Returned patient call, no answer, left vm to call.

## 2016-05-06 ENCOUNTER — Encounter: Payer: Self-pay | Admitting: Obstetrics and Gynecology

## 2016-05-06 ENCOUNTER — Ambulatory Visit (INDEPENDENT_AMBULATORY_CARE_PROVIDER_SITE_OTHER): Payer: BLUE CROSS/BLUE SHIELD | Admitting: Obstetrics and Gynecology

## 2016-05-06 VITALS — BP 116/77 | HR 82 | Wt 257.0 lb

## 2016-05-06 DIAGNOSIS — O0990 Supervision of high risk pregnancy, unspecified, unspecified trimester: Secondary | ICD-10-CM | POA: Insufficient documentation

## 2016-05-06 DIAGNOSIS — Z3490 Encounter for supervision of normal pregnancy, unspecified, unspecified trimester: Secondary | ICD-10-CM

## 2016-05-06 DIAGNOSIS — Z3401 Encounter for supervision of normal first pregnancy, first trimester: Secondary | ICD-10-CM

## 2016-05-06 DIAGNOSIS — Z3400 Encounter for supervision of normal first pregnancy, unspecified trimester: Secondary | ICD-10-CM

## 2016-05-06 NOTE — Patient Instructions (Signed)
 First Trimester of Pregnancy The first trimester of pregnancy is from week 1 until the end of week 12 (months 1 through 3). A week after a sperm fertilizes an egg, the egg will implant on the wall of the uterus. This embryo will begin to develop into a baby. Genes from you and your partner are forming the baby. The female genes determine whether the baby is a boy or a girl. At 6-8 weeks, the eyes and face are formed, and the heartbeat can be seen on ultrasound. At the end of 12 weeks, all the baby's organs are formed.  Now that you are pregnant, you will want to do everything you can to have a healthy baby. Two of the most important things are to get good prenatal care and to follow your health care provider's instructions. Prenatal care is all the medical care you receive before the baby's birth. This care will help prevent, find, and treat any problems during the pregnancy and childbirth. BODY CHANGES Your body goes through many changes during pregnancy. The changes vary from woman to woman.   You may gain or lose a couple of pounds at first.  You may feel sick to your stomach (nauseous) and throw up (vomit). If the vomiting is uncontrollable, call your health care provider.  You may tire easily.  You may develop headaches that can be relieved by medicines approved by your health care provider.  You may urinate more often. Painful urination may mean you have a bladder infection.  You may develop heartburn as a result of your pregnancy.  You may develop constipation because certain hormones are causing the muscles that push waste through your intestines to slow down.  You may develop hemorrhoids or swollen, bulging veins (varicose veins).  Your breasts may begin to grow larger and become tender. Your nipples may stick out more, and the tissue that surrounds them (areola) may become darker.  Your gums may bleed and may be sensitive to brushing and flossing.  Dark spots or blotches  (chloasma, mask of pregnancy) may develop on your face. This will likely fade after the baby is born.  Your menstrual periods will stop.  You may have a loss of appetite.  You may develop cravings for certain kinds of food.  You may have changes in your emotions from day to day, such as being excited to be pregnant or being concerned that something may go wrong with the pregnancy and baby.  You may have more vivid and strange dreams.  You may have changes in your hair. These can include thickening of your hair, rapid growth, and changes in texture. Some women also have hair loss during or after pregnancy, or hair that feels dry or thin. Your hair will most likely return to normal after your baby is born. WHAT TO EXPECT AT YOUR PRENATAL VISITS During a routine prenatal visit:  You will be weighed to make sure you and the baby are growing normally.  Your blood pressure will be taken.  Your abdomen will be measured to track your baby's growth.  The fetal heartbeat will be listened to starting around week 10 or 12 of your pregnancy.  Test results from any previous visits will be discussed. Your health care provider may ask you:  How you are feeling.  If you are feeling the baby move.  If you have had any abnormal symptoms, such as leaking fluid, bleeding, severe headaches, or abdominal cramping.  If you are using any tobacco   products, including cigarettes, chewing tobacco, and electronic cigarettes.  If you have any questions. Other tests that may be performed during your first trimester include:  Blood tests to find your blood type and to check for the presence of any previous infections. They will also be used to check for low iron levels (anemia) and Rh antibodies. Later in the pregnancy, blood tests for diabetes will be done along with other tests if problems develop.  Urine tests to check for infections, diabetes, or protein in the urine.  An ultrasound to confirm the  proper growth and development of the baby.  An amniocentesis to check for possible genetic problems.  Fetal screens for spina bifida and Down syndrome.  You may need other tests to make sure you and the baby are doing well.  HIV (human immunodeficiency virus) testing. Routine prenatal testing includes screening for HIV, unless you choose not to have this test. HOME CARE INSTRUCTIONS  Medicines   Follow your health care provider's instructions regarding medicine use. Specific medicines may be either safe or unsafe to take during pregnancy.  Take your prenatal vitamins as directed.  If you develop constipation, try taking a stool softener if your health care provider approves. Diet   Eat regular, well-balanced meals. Choose a variety of foods, such as meat or vegetable-based protein, fish, milk and low-fat dairy products, vegetables, fruits, and whole grain breads and cereals. Your health care provider will help you determine the amount of weight gain that is right for you.  Avoid raw meat and uncooked cheese. These carry germs that can cause birth defects in the baby.  Eating four or five small meals rather than three large meals a day may help relieve nausea and vomiting. If you start to feel nauseous, eating a few soda crackers can be helpful. Drinking liquids between meals instead of during meals also seems to help nausea and vomiting.  If you develop constipation, eat more high-fiber foods, such as fresh vegetables or fruit and whole grains. Drink enough fluids to keep your urine clear or pale yellow. Activity and Exercise   Exercise only as directed by your health care provider. Exercising will help you:  Control your weight.  Stay in shape.  Be prepared for labor and delivery.  Experiencing pain or cramping in the lower abdomen or low back is a good sign that you should stop exercising. Check with your health care provider before continuing normal exercises.  Try to avoid  standing for long periods of time. Move your legs often if you must stand in one place for a long time.  Avoid heavy lifting.  Wear low-heeled shoes, and practice good posture.  You may continue to have sex unless your health care provider directs you otherwise. Relief of Pain or Discomfort   Wear a good support bra for breast tenderness.   Take warm sitz baths to soothe any pain or discomfort caused by hemorrhoids. Use hemorrhoid cream if your health care provider approves.   Rest with your legs elevated if you have leg cramps or low back pain.  If you develop varicose veins in your legs, wear support hose. Elevate your feet for 15 minutes, 3-4 times a day. Limit salt in your diet. Prenatal Care   Schedule your prenatal visits by the twelfth week of pregnancy. They are usually scheduled monthly at first, then more often in the last 2 months before delivery.  Write down your questions. Take them to your prenatal visits.  Keep all your   prenatal visits as directed by your health care provider. Safety   Wear your seat belt at all times when driving.  Make a list of emergency phone numbers, including numbers for family, friends, the hospital, and police and fire departments. General Tips   Ask your health care provider for a referral to a local prenatal education class. Begin classes no later than at the beginning of month 6 of your pregnancy.  Ask for help if you have counseling or nutritional needs during pregnancy. Your health care provider can offer advice or refer you to specialists for help with various needs.  Do not use hot tubs, steam rooms, or saunas.  Do not douche or use tampons or scented sanitary pads.  Do not cross your legs for long periods of time.  Avoid cat litter boxes and soil used by cats. These carry germs that can cause birth defects in the baby and possibly loss of the fetus by miscarriage or stillbirth.  Avoid all smoking, herbs, alcohol, and  medicines not prescribed by your health care provider. Chemicals in these affect the formation and growth of the baby.  Do not use any tobacco products, including cigarettes, chewing tobacco, and electronic cigarettes. If you need help quitting, ask your health care provider. You may receive counseling support and other resources to help you quit.  Schedule a dentist appointment. At home, brush your teeth with a soft toothbrush and be gentle when you floss. SEEK MEDICAL CARE IF:   You have dizziness.  You have mild pelvic cramps, pelvic pressure, or nagging pain in the abdominal area.  You have persistent nausea, vomiting, or diarrhea.  You have a bad smelling vaginal discharge.  You have pain with urination.  You notice increased swelling in your face, hands, legs, or ankles. SEEK IMMEDIATE MEDICAL CARE IF:   You have a fever.  You are leaking fluid from your vagina.  You have spotting or bleeding from your vagina.  You have severe abdominal cramping or pain.  You have rapid weight gain or loss.  You vomit blood or material that looks like coffee grounds.  You are exposed to German measles and have never had them.  You are exposed to fifth disease or chickenpox.  You develop a severe headache.  You have shortness of breath.  You have any kind of trauma, such as from a fall or a car accident. This information is not intended to replace advice given to you by your health care provider. Make sure you discuss any questions you have with your health care provider. Document Released: 05/04/2001 Document Revised: 05/31/2014 Document Reviewed: 03/20/2013 Elsevier Interactive Patient Education  2017 [employer]  Contraception Choices Contraception (birth control) is the use of any methods or devices to prevent pregnancy. Below are some methods to help avoid pregnancy. Hormonal methods  Contraceptive implant. This is a thin, plastic tube containing progesterone hormone. It  does not contain estrogen hormone. Your health care provider inserts the tube in the inner part of the upper arm. The tube can remain in place for up to 3 years. After 3 years, the implant must be removed. The implant prevents the ovaries from releasing an egg (ovulation), thickens the cervical mucus to prevent sperm from entering the uterus, and thins the lining of the inside of the uterus.  Progesterone-only injections. These injections are given every 3 months by your health care provider to prevent pregnancy. This synthetic progesterone hormone stops the ovaries from releasing eggs. It also thickens cervical   mucus and changes the uterine lining. This makes it harder for sperm to survive in the uterus.  Birth control pills. These pills contain estrogen and progesterone hormone. They work by preventing the ovaries from releasing eggs (ovulation). They also cause the cervical mucus to thicken, preventing the sperm from entering the uterus. Birth control pills are prescribed by a health care provider.Birth control pills can also be used to treat heavy periods.  Minipill. This type of birth control pill contains only the progesterone hormone. They are taken every day of each month and must be prescribed by your health care provider.  Birth control patch. The patch contains hormones similar to those in birth control pills. It must be changed once a week and is prescribed by a health care provider.  Vaginal ring. The ring contains hormones similar to those in birth control pills. It is left in the vagina for 3 weeks, removed for 1 week, and then a new one is put back in place. The patient must be comfortable inserting and removing the ring from the vagina.A health care provider's prescription is necessary.  Emergency contraception. Emergency contraceptives prevent pregnancy after unprotected sexual intercourse. This pill can be taken right after sex or up to 5 days after unprotected sex. It is most  effective the sooner you take the pills after having sexual intercourse. Most emergency contraceptive pills are available without a prescription. Check with your pharmacist. Do not use emergency contraception as your only form of birth control. Barrier methods  Female condom. This is a thin sheath (latex or rubber) that is worn over the penis during sexual intercourse. It can be used with spermicide to increase effectiveness.  Female condom. This is a soft, loose-fitting sheath that is put into the vagina before sexual intercourse.  Diaphragm. This is a soft, latex, dome-shaped barrier that must be fitted by a health care provider. It is inserted into the vagina, along with a spermicidal jelly. It is inserted before intercourse. The diaphragm should be left in the vagina for 6 to 8 hours after intercourse.  Cervical cap. This is a round, soft, latex or plastic cup that fits over the cervix and must be fitted by a health care provider. The cap can be left in place for up to 48 hours after intercourse.  Sponge. This is a soft, circular piece of polyurethane foam. The sponge has spermicide in it. It is inserted into the vagina after wetting it and before sexual intercourse.  Spermicides. These are chemicals that kill or block sperm from entering the cervix and uterus. They come in the form of creams, jellies, suppositories, foam, or tablets. They do not require a prescription. They are inserted into the vagina with an applicator before having sexual intercourse. The process must be repeated every time you have sexual intercourse. Intrauterine contraception  Intrauterine device (IUD). This is a T-shaped device that is put in a woman's uterus during a menstrual period to prevent pregnancy. There are 2 types:  Copper IUD. This type of IUD is wrapped in copper wire and is placed inside the uterus. Copper makes the uterus and fallopian tubes produce a fluid that kills sperm. It can stay in place for 10  years.  Hormone IUD. This type of IUD contains the hormone progestin (synthetic progesterone). The hormone thickens the cervical mucus and prevents sperm from entering the uterus, and it also thins the uterine lining to prevent implantation of a fertilized egg. The hormone can weaken or kill the sperm that   get into the uterus. It can stay in place for 3-5 years, depending on which type of IUD is used. Permanent methods of contraception  Female tubal ligation. This is when the woman's fallopian tubes are surgically sealed, tied, or blocked to prevent the egg from traveling to the uterus.  Hysteroscopic sterilization. This involves placing a small coil or insert into each fallopian tube. Your doctor uses a technique called hysteroscopy to do the procedure. The device causes scar tissue to form. This results in permanent blockage of the fallopian tubes, so the sperm cannot fertilize the egg. It takes about 3 months after the procedure for the tubes to become blocked. You must use another form of birth control for these 3 months.  Female sterilization. This is when the female has the tubes that carry sperm tied off (vasectomy).This blocks sperm from entering the vagina during sexual intercourse. After the procedure, the man can still ejaculate fluid (semen). Natural planning methods  Natural family planning. This is not having sexual intercourse or using a barrier method (condom, diaphragm, cervical cap) on days the woman could become pregnant.  Calendar method. This is keeping track of the length of each menstrual cycle and identifying when you are fertile.  Ovulation method. This is avoiding sexual intercourse during ovulation.  Symptothermal method. This is avoiding sexual intercourse during ovulation, using a thermometer and ovulation symptoms.  Post-ovulation method. This is timing sexual intercourse after you have ovulated. Regardless of which type or method of contraception you choose, it is  important that you use condoms to protect against the transmission of sexually transmitted infections (STIs). Talk with your health care provider about which form of contraception is most appropriate for you. This information is not intended to replace advice given to you by your health care provider. Make sure you discuss any questions you have with your health care provider. Document Released: 05/10/2005 Document Revised: 10/16/2015 Document Reviewed: 11/02/2012 Elsevier Interactive Patient Education  2017 [employer]   Breastfeeding Deciding to breastfeed is one of the best choices you can make for you and your baby. A change in hormones during pregnancy causes your breast tissue to grow and increases the number and size of your milk ducts. These hormones also allow proteins, sugars, and fats from your blood supply to make breast milk in your milk-producing glands. Hormones prevent breast milk from being released before your baby is born as well as prompt milk flow after birth. Once breastfeeding has begun, thoughts of your baby, as well as his or her sucking or crying, can stimulate the release of milk from your milk-producing glands. Benefits of breastfeeding For Your Baby  Your first milk (colostrum) helps your baby's digestive system function better.  There are antibodies in your milk that help your baby fight off infections.  Your baby has a lower incidence of asthma, allergies, and sudden infant death syndrome.  The nutrients in breast milk are better for your baby than infant formulas and are designed uniquely for your baby's needs.  Breast milk improves your baby's brain development.  Your baby is less likely to develop other conditions, such as childhood obesity, asthma, or type 2 diabetes mellitus. For You  Breastfeeding helps to create a very special bond between you and your baby.  Breastfeeding is convenient. Breast milk is always available at the correct temperature and  costs nothing.  Breastfeeding helps to burn calories and helps you lose the weight gained during pregnancy.  Breastfeeding makes your uterus contract to its   prepregnancy size faster and slows bleeding (lochia) after you give birth.  Breastfeeding helps to lower your risk of developing type 2 diabetes mellitus, osteoporosis, and breast or ovarian cancer later in life. Signs that your baby is hungry Early Signs of Hunger  Increased alertness or activity.  Stretching.  Movement of the head from side to side.  Movement of the head and opening of the mouth when the corner of the mouth or cheek is stroked (rooting).  Increased sucking sounds, smacking lips, cooing, sighing, or squeaking.  Hand-to-mouth movements.  Increased sucking of fingers or hands. Late Signs of Hunger  Fussing.  Intermittent crying. Extreme Signs of Hunger  Signs of extreme hunger will require calming and consoling before your baby will be able to breastfeed successfully. Do not wait for the following signs of extreme hunger to occur before you initiate breastfeeding:  Restlessness.  A loud, strong cry.  Screaming. Breastfeeding basics  Breastfeeding Initiation  Find a comfortable place to sit or lie down, with your neck and back well supported.  Place a pillow or rolled up blanket under your baby to bring him or her to the level of your breast (if you are seated). Nursing pillows are specially designed to help support your arms and your baby while you breastfeed.  Make sure that your baby's abdomen is facing your abdomen.  Gently massage your breast. With your fingertips, massage from your chest wall toward your nipple in a circular motion. This encourages milk flow. You may need to continue this action during the feeding if your milk flows slowly.  Support your breast with 4 fingers underneath and your thumb above your nipple. Make sure your fingers are well away from your nipple and your baby's  mouth.  Stroke your baby's lips gently with your finger or nipple.  When your baby's mouth is open wide enough, quickly bring your baby to your breast, placing your entire nipple and as much of the colored area around your nipple (areola) as possible into your baby's mouth.  More areola should be visible above your baby's upper lip than below the lower lip.  Your baby's tongue should be between his or her lower gum and your breast.  Ensure that your baby's mouth is correctly positioned around your nipple (latched). Your baby's lips should create a seal on your breast and be turned out (everted).  It is common for your baby to suck about 2-3 minutes in order to start the flow of breast milk. Latching  Teaching your baby how to latch on to your breast properly is very important. An improper latch can cause nipple pain and decreased milk supply for you and poor weight gain in your baby. Also, if your baby is not latched onto your nipple properly, he or she may swallow some air during feeding. This can make your baby fussy. Burping your baby when you switch breasts during the feeding can help to get rid of the air. However, teaching your baby to latch on properly is still the best way to prevent fussiness from swallowing air while breastfeeding. Signs that your baby has successfully latched on to your nipple:  Silent tugging or silent sucking, without causing you pain.  Swallowing heard between every 3-4 sucks.  Muscle movement above and in front of his or her ears while sucking. Signs that your baby has not successfully latched on to nipple:  Sucking sounds or smacking sounds from your baby while breastfeeding.  Nipple pain. If you think your   baby has not latched on correctly, slip your finger into the corner of your baby's mouth to break the suction and place it between your baby's gums. Attempt breastfeeding initiation again. Signs of Successful Breastfeeding  Signs from your baby:  A  gradual decrease in the number of sucks or complete cessation of sucking.  Falling asleep.  Relaxation of his or her body.  Retention of a small amount of milk in his or her mouth.  Letting go of your breast by himself or herself. Signs from you:  Breasts that have increased in firmness, weight, and size 1-3 hours after feeding.  Breasts that are softer immediately after breastfeeding.  Increased milk volume, as well as a change in milk consistency and color by the fifth day of breastfeeding.  Nipples that are not sore, cracked, or bleeding. Signs That Your Baby is Getting Enough Milk  Wetting at least 1-2 diapers during the first 24 hours after birth.  Wetting at least 5-6 diapers every 24 hours for the first week after birth. The urine should be clear or pale yellow by 5 days after birth.  Wetting 6-8 diapers every 24 hours as your baby continues to grow and develop.  At least 3 stools in a 24-hour period by age 5 days. The stool should be soft and yellow.  At least 3 stools in a 24-hour period by age 7 days. The stool should be seedy and yellow.  No loss of weight greater than 10% of birth weight during the first 3 days of age.  Average weight gain of 4-7 ounces (113-198 g) per week after age 4 days.  Consistent daily weight gain by age 5 days, without weight loss after the age of 2 weeks. After a feeding, your baby may spit up a small amount. This is common. Breastfeeding frequency and duration Frequent feeding will help you make more milk and can prevent sore nipples and breast engorgement. Breastfeed when you feel the need to reduce the fullness of your breasts or when your baby shows signs of hunger. This is called "breastfeeding on demand." Avoid introducing a pacifier to your baby while you are working to establish breastfeeding (the first 4-6 weeks after your baby is born). After this time you may choose to use a pacifier. Research has shown that pacifier use during the  first year of a baby's life decreases the risk of sudden infant death syndrome (SIDS). Allow your baby to feed on each breast as long as he or she wants. Breastfeed until your baby is finished feeding. When your baby unlatches or falls asleep while feeding from the first breast, offer the second breast. Because newborns are often sleepy in the first few weeks of life, you may need to awaken your baby to get him or her to feed. Breastfeeding times will vary from baby to baby. However, the following rules can serve as a guide to help you ensure that your baby is properly fed:  Newborns (babies 4 weeks of age or younger) may breastfeed every 1-3 hours.  Newborns should not go longer than 3 hours during the day or 5 hours during the night without breastfeeding.  You should breastfeed your baby a minimum of 8 times in a 24-hour period until you begin to introduce solid foods to your baby at around 6 months of age. Breast milk pumping Pumping and storing breast milk allows you to ensure that your baby is exclusively fed your breast milk, even at times when you are unable   to breastfeed. This is especially important if you are going back to work while you are still breastfeeding or when you are not able to be present during feedings. Your lactation consultant can give you guidelines on how long it is safe to store breast milk. A breast pump is a machine that allows you to pump milk from your breast into a sterile bottle. The pumped breast milk can then be stored in a refrigerator or freezer. Some breast pumps are operated by hand, while others use electricity. Ask your lactation consultant which type will work best for you. Breast pumps can be purchased, but some hospitals and breastfeeding support groups lease breast pumps on a monthly basis. A lactation consultant can teach you how to hand express breast milk, if you prefer not to use a pump. Caring for your breasts while you breastfeed Nipples can become  dry, cracked, and sore while breastfeeding. The following recommendations can help keep your breasts moisturized and healthy:  Avoid using soap on your nipples.  Wear a supportive bra. Although not required, special nursing bras and tank tops are designed to allow access to your breasts for breastfeeding without taking off your entire bra or top. Avoid wearing underwire-style bras or extremely tight bras.  Air dry your nipples for 3-4minutes after each feeding.  Use only cotton bra pads to absorb leaked breast milk. Leaking of breast milk between feedings is normal.  Use lanolin on your nipples after breastfeeding. Lanolin helps to maintain your skin's normal moisture barrier. If you use pure lanolin, you do not need to wash it off before feeding your baby again. Pure lanolin is not toxic to your baby. You may also hand express a few drops of breast milk and gently massage that milk into your nipples and allow the milk to air dry. In the first few weeks after giving birth, some women experience extremely full breasts (engorgement). Engorgement can make your breasts feel heavy, warm, and tender to the touch. Engorgement peaks within 3-5 days after you give birth. The following recommendations can help ease engorgement:  Completely empty your breasts while breastfeeding or pumping. You may want to start by applying warm, moist heat (in the shower or with warm water-soaked hand towels) just before feeding or pumping. This increases circulation and helps the milk flow. If your baby does not completely empty your breasts while breastfeeding, pump any extra milk after he or she is finished.  Wear a snug bra (nursing or regular) or tank top for 1-2 days to signal your body to slightly decrease milk production.  Apply ice packs to your breasts, unless this is too uncomfortable for you.  Make sure that your baby is latched on and positioned properly while breastfeeding. If engorgement persists after 48  hours of following these recommendations, contact your health care provider or a lactation consultant. Overall health care recommendations while breastfeeding  Eat healthy foods. Alternate between meals and snacks, eating 3 of each per day. Because what you eat affects your breast milk, some of the foods may make your baby more irritable than usual. Avoid eating these foods if you are sure that they are negatively affecting your baby.  Drink milk, fruit juice, and water to satisfy your thirst (about 10 glasses a day).  Rest often, relax, and continue to take your prenatal vitamins to prevent fatigue, stress, and anemia.  Continue breast self-awareness checks.  Avoid chewing and smoking tobacco. Chemicals from cigarettes that pass into breast milk and exposure to   secondhand smoke may harm your baby.  Avoid alcohol and drug use, including marijuana. Some medicines that may be harmful to your baby can pass through breast milk. It is important to ask your health care provider before taking any medicine, including all over-the-counter and prescription medicine as well as vitamin and herbal supplements. It is possible to become pregnant while breastfeeding. If birth control is desired, ask your health care provider about options that will be safe for your baby. Contact a health care provider if:  You feel like you want to stop breastfeeding or have become frustrated with breastfeeding.  You have painful breasts or nipples.  Your nipples are cracked or bleeding.  Your breasts are red, tender, or warm.  You have a swollen area on either breast.  You have a fever or chills.  You have nausea or vomiting.  You have drainage other than breast milk from your nipples.  Your breasts do not become full before feedings by the fifth day after you give birth.  You feel sad and depressed.  Your baby is too sleepy to eat well.  Your baby is having trouble sleeping.  Your baby is wetting less  than 3 diapers in a 24-hour period.  Your baby has less than 3 stools in a 24-hour period.  Your baby's skin or the white part of his or her eyes becomes yellow.  Your baby is not gaining weight by 5 days of age. Get help right away if:  Your baby is overly tired (lethargic) and does not want to wake up and feed.  Your baby develops an unexplained fever. This information is not intended to replace advice given to you by your health care provider. Make sure you discuss any questions you have with your health care provider. Document Released: 05/10/2005 Document Revised: 10/22/2015 Document Reviewed: 11/01/2012 Elsevier Interactive Patient Education  2017 [employer] Inc.  

## 2016-05-06 NOTE — Progress Notes (Signed)
  Subjective:    Katherine Preston is a G1P0000 3440w4d being seen today for her first obstetrical visit.  Her obstetrical history is significant for first pregnancy. Patient does intend to breast feed. Pregnancy history fully reviewed.  Patient reports no complaints.  Vitals:   05/06/16 0943  BP: 116/77  Pulse: 82  Weight: 257 lb (116.6 kg)    HISTORY: OB History  Gravida Para Term Preterm AB Living  1 0 0 0 0 0  SAB TAB Ectopic Multiple Live Births  0 0 0 0      # Outcome Date GA Lbr Len/2nd Weight Sex Delivery Anes PTL Lv  1 Current              Past Medical History:  Diagnosis Date  . Allergic rhinitis   . Anxiety    per patient, no diagnosis  . Sleep apnea    Past Surgical History:  Procedure Laterality Date  . NO PAST SURGERIES     Family History  Problem Relation Age of Onset  . Fibroids Mother      Exam    Uterus:     Pelvic Exam:    Perineum: No Hemorrhoids, Normal Perineum   Vulva: normal   Vagina:  normal mucosa, normal discharge   pH:    Cervix: nulliparous appearance and closed and long   Adnexa: normal adnexa and no mass, fullness, tenderness   Bony Pelvis: gynecoid  System: Breast:  normal appearance, no masses or tenderness   Skin: normal coloration and turgor, no rashes    Neurologic: oriented, no focal deficits   Extremities: normal strength, tone, and muscle mass   HEENT extra ocular movement intact   Mouth/Teeth mucous membranes moist, pharynx normal without lesions and dental hygiene good   Neck supple and no masses   Cardiovascular: regular rate and rhythm   Respiratory:  chest clear, no wheezing, crepitations, rhonchi, normal symmetric air entry   Abdomen: soft, non-tender; bowel sounds normal; no masses,  no organomegaly   Urinary:       Assessment:    Pregnancy: G1P0000 Patient Active Problem List   Diagnosis Date Noted  . Supervision of normal pregnancy, antepartum 05/06/2016  . Encounter for Nexplanon removal  09/23/2015  . Abdominal pain, chronic, right lower quadrant 05/01/2014  . Constipation 05/01/2014  . Abnormal uterine bleeding 08/03/2013  . High BMI 08/03/2013        Plan:     Initial labs drawn. Prenatal vitamins. Problem list reviewed and updated. Genetic Screening discussed First Screen: ordered.  Ultrasound discussed; fetal survey: requested. Patient with Normal pap smear 08/2015 Patient already received flu vaccine  Follow up in 4 weeks. 50% of 30 min visit spent on counseling and coordination of care.     Alif Petrak 05/06/2016

## 2016-05-06 NOTE — Progress Notes (Signed)
BRX Info:  Email: [email or web address] PHONE: [phone] Due Date 11/28/16

## 2016-05-10 ENCOUNTER — Other Ambulatory Visit: Payer: BLUE CROSS/BLUE SHIELD

## 2016-05-10 DIAGNOSIS — Z3400 Encounter for supervision of normal first pregnancy, unspecified trimester: Secondary | ICD-10-CM

## 2016-05-10 LAB — OB RESULTS CONSOLE GBS: GBS: POSITIVE

## 2016-05-11 LAB — GLUCOSE TOLERANCE, 2 HOURS W/ 1HR
Glucose, 1 hour: 100 mg/dL (ref 65–179)
Glucose, 2 hour: 108 mg/dL (ref 65–152)
Glucose, Fasting: 87 mg/dL (ref 65–91)

## 2016-05-15 ENCOUNTER — Encounter: Payer: Self-pay | Admitting: Obstetrics and Gynecology

## 2016-05-15 ENCOUNTER — Other Ambulatory Visit: Payer: Self-pay | Admitting: Obstetrics and Gynecology

## 2016-05-15 DIAGNOSIS — R8271 Bacteriuria: Secondary | ICD-10-CM | POA: Insufficient documentation

## 2016-05-15 LAB — URINE CULTURE, OB REFLEX

## 2016-05-15 LAB — CULTURE, OB URINE

## 2016-05-15 MED ORDER — PENICILLIN V POTASSIUM 500 MG PO TABS: 500.0000 mg | ORAL_TABLET | Freq: Four times a day (QID) | ORAL | 0 refills | Status: DC

## 2016-05-18 LAB — OBSTETRIC PANEL, INCLUDING HIV
Antibody Screen: NEGATIVE
BASOS ABS: 0 10*3/uL (ref 0.0–0.2)
Basos: 0 %
EOS (ABSOLUTE): 0.2 10*3/uL (ref 0.0–0.4)
Eos: 3 %
HIV SCREEN 4TH GENERATION: NONREACTIVE
Hematocrit: 34.7 % (ref 34.0–46.6)
Hemoglobin: 11.3 g/dL (ref 11.1–15.9)
Hepatitis B Surface Ag: NEGATIVE
Immature Grans (Abs): 0 10*3/uL (ref 0.0–0.1)
Immature Granulocytes: 0 %
LYMPHS ABS: 1.6 10*3/uL (ref 0.7–3.1)
Lymphs: 24 %
MCH: 24 pg — AB (ref 26.6–33.0)
MCHC: 32.6 g/dL (ref 31.5–35.7)
MCV: 74 fL — AB (ref 79–97)
MONOCYTES: 7 %
Monocytes Absolute: 0.5 10*3/uL (ref 0.1–0.9)
NEUTROS ABS: 4.3 10*3/uL (ref 1.4–7.0)
NEUTROS PCT: 66 %
PLATELETS: 271 10*3/uL (ref 150–379)
RBC: 4.7 x10E6/uL (ref 3.77–5.28)
RDW: 15.4 % (ref 12.3–15.4)
RPR Ser Ql: NONREACTIVE
Rh Factor: POSITIVE
Rubella Antibodies, IGG: 2.39 index (ref >0.99)
WBC: 6.5 10*3/uL (ref 3.4–10.8)

## 2016-05-18 LAB — CYSTIC FIBROSIS MUTATION 97: GENE DIS ANAL CARRIER INTERP BLD/T-IMP: NOT DETECTED

## 2016-05-18 LAB — HEMOGLOBINOPATHY EVALUATION
HEMOGLOBIN F QUANTITATION: 0 % (ref 0.0–2.0)
HGB C: 0 %
HGB S: 0 %
HGB VARIANT: 0 %
Hemoglobin A2 Quantitation: 2.8 % (ref 1.8–3.2)
Hgb A: 97.2 % (ref 96.4–98.8)

## 2016-05-18 LAB — TOXASSURE SELECT 13 (MW), URINE

## 2016-05-18 LAB — VARICELLA ZOSTER ANTIBODY, IGG: VARICELLA: 492 {index} (ref >165)

## 2016-05-24 NOTE — L&D Delivery Note (Signed)
Delivery Note At 6:02 AM a viable female was delivered via Vaginal, Spontaneous Delivery (Presentation: ROA;  ).  APGAR: 7, 9; weight  .  Infant placed on maternal abdomen and stimulated to cry; cord clamped and cut after 1 min.  Placenta status: intact .  Cord: 3 vc with the following complications: none. Placenta examiend and intact.   Cord pH: not collected  Anesthesia:   Episiotomy: None Lacerations: 2nd degree;Perineal Suture Repair: 3.0 Est. Blood Loss (mL): 200  Mom to postpartum.  Baby to Couplet care / Skin to Skin.  Charlesetta GaribaldiKathryn Lorraine Kooistra CNM 11/22/2016, 6:44 AM

## 2016-05-26 ENCOUNTER — Telehealth: Payer: Self-pay | Admitting: *Deleted

## 2016-05-26 ENCOUNTER — Encounter: Payer: Self-pay | Admitting: *Deleted

## 2016-05-26 NOTE — Telephone Encounter (Signed)
Busy signal

## 2016-05-26 NOTE — Telephone Encounter (Signed)
Sent patient message in mychart.  

## 2016-05-26 NOTE — Telephone Encounter (Signed)
-----   Message from Catalina AntiguaPeggy Constant, MD sent at 05/15/2016  6:25 AM EST ----- Please inform patient of UTI. Rx has been e-prescribed  AnimatorThanks  Peggy

## 2016-05-27 ENCOUNTER — Encounter (HOSPITAL_COMMUNITY): Payer: Self-pay

## 2016-05-27 ENCOUNTER — Other Ambulatory Visit: Payer: Self-pay | Admitting: Obstetrics and Gynecology

## 2016-05-27 ENCOUNTER — Ambulatory Visit (HOSPITAL_COMMUNITY)
Admission: RE | Admit: 2016-05-27 | Discharge: 2016-05-27 | Disposition: A | Discharge status: Home / Self Care | Payer: BLUE CROSS/BLUE SHIELD | Source: Ambulatory Visit | Attending: Obstetrics and Gynecology | Admitting: Obstetrics and Gynecology

## 2016-05-27 DIAGNOSIS — Z3400 Encounter for supervision of normal first pregnancy, unspecified trimester: Secondary | ICD-10-CM

## 2016-05-27 DIAGNOSIS — Z3401 Encounter for supervision of normal first pregnancy, first trimester: Secondary | ICD-10-CM

## 2016-05-27 DIAGNOSIS — R8271 Bacteriuria: Secondary | ICD-10-CM

## 2016-05-27 DIAGNOSIS — O99211 Obesity complicating pregnancy, first trimester: Secondary | ICD-10-CM | POA: Diagnosis present

## 2016-05-27 DIAGNOSIS — Z3A13 13 weeks gestation of pregnancy: Secondary | ICD-10-CM | POA: Diagnosis not present

## 2016-05-27 DIAGNOSIS — Z3682 Encounter for antenatal screening for nuchal translucency: Secondary | ICD-10-CM

## 2016-05-27 NOTE — ED Notes (Addendum)
Pt was informed that she needs to pick up her PCN prescription for UTI. Pt reports that she received a message on my chart, tried to call the clinic, waited on hold and eventually hung up. So she did not know about the UTI or PCN prescription.

## 2016-06-01 ENCOUNTER — Other Ambulatory Visit: Payer: Self-pay | Admitting: Obstetrics and Gynecology

## 2016-06-02 ENCOUNTER — Ambulatory Visit (HOSPITAL_COMMUNITY): Payer: BLUE CROSS/BLUE SHIELD

## 2016-06-02 ENCOUNTER — Encounter: Payer: Self-pay | Admitting: *Deleted

## 2016-06-03 ENCOUNTER — Ambulatory Visit (INDEPENDENT_AMBULATORY_CARE_PROVIDER_SITE_OTHER): Payer: BLUE CROSS/BLUE SHIELD | Admitting: Certified Nurse Midwife

## 2016-06-03 VITALS — BP 115/77 | HR 98 | Wt 255.0 lb

## 2016-06-03 DIAGNOSIS — E6609 Other obesity due to excess calories: Secondary | ICD-10-CM

## 2016-06-03 DIAGNOSIS — IMO0001 Reserved for inherently not codable concepts without codable children: Secondary | ICD-10-CM

## 2016-06-03 DIAGNOSIS — Z3492 Encounter for supervision of normal pregnancy, unspecified, second trimester: Secondary | ICD-10-CM

## 2016-06-03 DIAGNOSIS — Z3490 Encounter for supervision of normal pregnancy, unspecified, unspecified trimester: Secondary | ICD-10-CM

## 2016-06-03 DIAGNOSIS — R8271 Bacteriuria: Secondary | ICD-10-CM

## 2016-06-03 DIAGNOSIS — O99212 Obesity complicating pregnancy, second trimester: Secondary | ICD-10-CM

## 2016-06-03 DIAGNOSIS — Z6841 Body Mass Index (BMI) 40.0 and over, adult: Secondary | ICD-10-CM

## 2016-06-03 NOTE — Patient Instructions (Addendum)
Second Trimester of Pregnancy The second trimester is from week 13 through week 28 (months 4 through 6). The second trimester is often a time when you feel your best. Your body has also adjusted to being pregnant, and you begin to feel better physically. Usually, morning sickness has lessened or quit completely, you may have more energy, and you may have an increase in appetite. The second trimester is also a time when the fetus is growing rapidly. At the end of the sixth month, the fetus is about 9 inches long and weighs about 1 pounds. You will likely begin to feel the baby move (quickening) between 18 and 20 weeks of the pregnancy. Body changes during your second trimester Your body continues to go through many changes during your second trimester. The changes vary from woman to woman.  Your weight will continue to increase. You will notice your lower abdomen bulging out.  You may begin to get stretch marks on your hips, abdomen, and breasts.  You may develop headaches that can be relieved by medicines. The medicines should be approved by your health care provider.  You may urinate more often because the fetus is pressing on your bladder.  You may develop or continue to have heartburn as a result of your pregnancy.  You may develop constipation because certain hormones are causing the muscles that push waste through your intestines to slow down.  You may develop hemorrhoids or swollen, bulging veins (varicose veins).  You may have back pain. This is caused by:  Weight gain.  Pregnancy hormones that are relaxing the joints in your pelvis.  A shift in weight and the muscles that support your balance.  Your breasts will continue to grow and they will continue to become tender.  Your gums may bleed and may be sensitive to brushing and flossing.  Dark spots or blotches (chloasma, mask of pregnancy) may develop on your face. This will likely fade after the baby is born.  A dark line  from your belly button to the pubic area (linea nigra) may appear. This will likely fade after the baby is born.  You may have changes in your hair. These can include thickening of your hair, rapid growth, and changes in texture. Some women also have hair loss during or after pregnancy, or hair that feels dry or thin. Your hair will most likely return to normal after your baby is born. What to expect at prenatal visits During a routine prenatal visit:  You will be weighed to make sure you and the fetus are growing normally.  Your blood pressure will be taken.  Your abdomen will be measured to track your baby's growth.  The fetal heartbeat will be listened to.  Any test results from the previous visit will be discussed. Your health care provider may ask you:  How you are feeling.  If you are feeling the baby move.  If you have had any abnormal symptoms, such as leaking fluid, bleeding, severe headaches, or abdominal cramping.  If you are using any tobacco products, including cigarettes, chewing tobacco, and electronic cigarettes.  If you have any questions. Other tests that may be performed during your second trimester include:  Blood tests that check for:  Low iron levels (anemia).  Gestational diabetes (between 24 and 28 weeks).  Rh antibodies. This is to check for a protein on red blood cells (Rh factor).  Urine tests to check for infections, diabetes, or protein in the urine.  An ultrasound to   confirm the proper growth and development of the baby.  An amniocentesis to check for possible genetic problems.  Fetal screens for spina bifida and Down syndrome.  HIV (human immunodeficiency virus) testing. Routine prenatal testing includes screening for HIV, unless you choose not to have this test. Follow these instructions at home: Eating and drinking  Continue to eat regular, healthy meals.  Avoid raw meat, uncooked cheese, cat litter boxes, and soil used by cats. These  carry germs that can cause birth defects in the baby.  Take your prenatal vitamins.  Take 1500-2000 mg of calcium daily starting at the 20th week of pregnancy until you deliver your baby.  If you develop constipation:  Take over-the-counter or prescription medicines.  Drink enough fluid to keep your urine clear or pale yellow.  Eat foods that are high in fiber, such as fresh fruits and vegetables, whole grains, and beans.  Limit foods that are high in fat and processed sugars, such as fried and sweet foods. Activity  Exercise only as directed by your health care provider. Experiencing uterine cramps is a good sign to stop exercising.  Avoid heavy lifting, wear low heel shoes, and practice good posture.  Wear your seat belt at all times when driving.  Rest with your legs elevated if you have leg cramps or low back pain.  Wear a good support bra for breast tenderness.  Do not use hot tubs, steam rooms, or saunas. Lifestyle  Avoid all smoking, herbs, alcohol, and unprescribed drugs. These chemicals affect the formation and growth of the baby.  Do not use any products that contain nicotine or tobacco, such as cigarettes and e-cigarettes. If you need help quitting, ask your health care provider.  A sexual relationship may be continued unless your health care provider directs you otherwise. General instructions  Follow your health care provider's instructions regarding medicine use. There are medicines that are either safe or unsafe to take during pregnancy.  Take warm sitz baths to soothe any pain or discomfort caused by hemorrhoids. Use hemorrhoid cream if your health care provider approves.  If you develop varicose veins, wear support hose. Elevate your feet for 15 minutes, 3-4 times a day. Limit salt in your diet.  Visit your dentist if you have not gone yet during your pregnancy. Use a soft toothbrush to brush your teeth and be gentle when you floss.  Keep all follow-up  prenatal visits as told by your health care provider. This is important. Contact a health care provider if:  You have dizziness.  You have mild pelvic cramps, pelvic pressure, or nagging pain in the abdominal area.  You have persistent nausea, vomiting, or diarrhea.  You have a bad smelling vaginal discharge.  You have pain with urination. Get help right away if:  You have a fever.  You are leaking fluid from your vagina.  You have spotting or bleeding from your vagina.  You have severe abdominal cramping or pain.  You have rapid weight gain or weight loss.  You have shortness of breath with chest pain.  You notice sudden or extreme swelling of your face, hands, ankles, feet, or legs.  You have not felt your baby move in over an hour.  You have severe headaches that do not go away with medicine.  You have vision changes. Summary  The second trimester is from week 13 through week 28 (months 4 through 6). It is also a time when the fetus is growing rapidly.  Your body goes   through many changes during pregnancy. The changes vary from woman to woman.  Avoid all smoking, herbs, alcohol, and unprescribed drugs. These chemicals affect the formation and growth your baby.  Do not use any tobacco products, such as cigarettes, chewing tobacco, and e-cigarettes. If you need help quitting, ask your health care provider.  Contact your health care provider if you have any questions. Keep all prenatal visits as told by your health care provider. This is important. This information is not intended to replace advice given to you by your health care provider. Make sure you discuss any questions you have with your health care provider. Document Released: 05/04/2001 Document Revised: 10/16/2015 Document Reviewed: 07/11/2012 Elsevier Interactive Patient Education  2017 [employer] Inc.  

## 2016-06-03 NOTE — Progress Notes (Signed)
   PRENATAL VISIT NOTE  Subjective:  Katherine Preston is a 21 y.o. G1P0000 at 6754w4d being seen today for ongoing prenatal care.  She is currently monitored for the following issues for this low-risk pregnancy and has Obese; Supervision of normal pregnancy, antepartum; and GBS bacteriuria on her problem list.  Patient reports no complaints.  Contractions: Not present. Vag. Bleeding: None.  Movement: Absent. Denies leaking of fluid.   The following portions of the patient's history were reviewed and updated as appropriate: allergies, current medications, past family history, past medical history, past social history, past surgical history and problem list. Problem list updated.  Objective:   Vitals:   06/03/16 1001  BP: 115/77  Pulse: 98  Weight: 255 lb (115.7 kg)    Fetal Status: Fetal Heart Rate (bpm): 142 Fundal Height: 14 cm Movement: Absent     General:  Alert, oriented and cooperative. Patient is in no acute distress.  Skin: Skin is warm and dry. No rash noted.   Cardiovascular: Normal heart rate noted  Respiratory: Normal respiratory effort, no problems with respiration noted  Abdomen: Soft, gravid, appropriate for gestational age. Pain/Pressure: Absent     Pelvic:  Cervical exam deferred        Extremities: Normal range of motion.  Edema: None  Mental Status: Normal mood and affect. Normal behavior. Normal judgment and thought content.   Assessment and Plan:  Pregnancy: G1P0000 at 3154w4d  1. Encounter for supervision of normal pregnancy, antepartum, unspecified gravidity      Doing well      Mat 21 today      US at MFM ordered today  2. Class 3 obesity due to excess calories without serious comorbidity with body mass index (BMI) of 45.0 to 49.9 in adult (HCC)       3. GBS bacteriuria     Antibiotics needed in labor  Preterm labor symptoms and general obstetric precautions including but not limited to vaginal bleeding, contractions, leaking of fluid and fetal movement  were reviewed in detail with the patient. Please refer to After Visit Summary for other counseling recommendations.  Return in about 4 weeks (around 07/01/2016) for ROB.   Roe Coombsachelle A Denney, CNM

## 2016-06-11 ENCOUNTER — Other Ambulatory Visit: Payer: Self-pay | Admitting: Certified Nurse Midwife

## 2016-06-11 ENCOUNTER — Telehealth: Payer: Self-pay | Admitting: *Deleted

## 2016-06-11 DIAGNOSIS — R51 Headache: Principal | ICD-10-CM

## 2016-06-11 DIAGNOSIS — O26892 Other specified pregnancy related conditions, second trimester: Secondary | ICD-10-CM

## 2016-06-11 MED ORDER — BUTALBITAL-APAP-CAFFEINE 50-325-40 MG PO TABS: 2.0000 | ORAL_TABLET | Freq: Four times a day (QID) | ORAL | 5 refills | Status: DC | PRN

## 2016-06-11 NOTE — Telephone Encounter (Signed)
Attempt to contact pt. LM on VM making her aware of refill and will need to pick up at office.

## 2016-06-11 NOTE — Telephone Encounter (Signed)
Pt called to office for refill on headache medicine, Fioricet.  Return call to pt.  Pt made aware request will be sent to provider. Pt will be made aware if approved and may need to pick up Rx.   Please advise on refill of Fioricet.

## 2016-06-11 NOTE — Telephone Encounter (Signed)
Please let her know that I have signed an Rx for her with refills, cannot e-prescribe this one.  Thank you.  R.Anyela Napierkowski CNM

## 2016-06-12 LAB — MATERNIT21 PLUS CORE+SCA
Chromosome 13: NEGATIVE
Chromosome 18: NEGATIVE
Chromosome 21: NEGATIVE
PDF: 0
Y Chromosome: NOT DETECTED

## 2016-06-14 ENCOUNTER — Other Ambulatory Visit: Payer: Self-pay | Admitting: Certified Nurse Midwife

## 2016-06-14 DIAGNOSIS — Z3400 Encounter for supervision of normal first pregnancy, unspecified trimester: Secondary | ICD-10-CM

## 2016-06-17 ENCOUNTER — Telehealth: Payer: Self-pay | Admitting: *Deleted

## 2016-06-17 NOTE — Telephone Encounter (Signed)
Spoke with pt regarding Materni21 results. Pt made aware of gender.  Pt made aware gender usually verified at anatomy u/s as well.

## 2016-06-30 ENCOUNTER — Ambulatory Visit (HOSPITAL_COMMUNITY)
Admission: RE | Admit: 2016-06-30 | Discharge: 2016-06-30 | Disposition: A | Discharge status: Home / Self Care | Payer: BLUE CROSS/BLUE SHIELD | Source: Ambulatory Visit | Attending: Certified Nurse Midwife | Admitting: Certified Nurse Midwife

## 2016-06-30 DIAGNOSIS — Z363 Encounter for antenatal screening for malformations: Secondary | ICD-10-CM | POA: Insufficient documentation

## 2016-06-30 DIAGNOSIS — Z3A18 18 weeks gestation of pregnancy: Secondary | ICD-10-CM | POA: Diagnosis not present

## 2016-06-30 DIAGNOSIS — Z6841 Body Mass Index (BMI) 40.0 and over, adult: Secondary | ICD-10-CM | POA: Insufficient documentation

## 2016-06-30 DIAGNOSIS — O99210 Obesity complicating pregnancy, unspecified trimester: Secondary | ICD-10-CM | POA: Diagnosis not present

## 2016-06-30 DIAGNOSIS — Z3490 Encounter for supervision of normal pregnancy, unspecified, unspecified trimester: Secondary | ICD-10-CM

## 2016-07-01 ENCOUNTER — Ambulatory Visit (INDEPENDENT_AMBULATORY_CARE_PROVIDER_SITE_OTHER): Payer: BLUE CROSS/BLUE SHIELD | Admitting: Certified Nurse Midwife

## 2016-07-01 VITALS — BP 108/76 | HR 93 | Wt 252.0 lb

## 2016-07-01 DIAGNOSIS — Z3400 Encounter for supervision of normal first pregnancy, unspecified trimester: Secondary | ICD-10-CM

## 2016-07-01 DIAGNOSIS — Z6841 Body Mass Index (BMI) 40.0 and over, adult: Secondary | ICD-10-CM

## 2016-07-01 DIAGNOSIS — IMO0001 Reserved for inherently not codable concepts without codable children: Secondary | ICD-10-CM

## 2016-07-01 DIAGNOSIS — O99212 Obesity complicating pregnancy, second trimester: Secondary | ICD-10-CM

## 2016-07-01 DIAGNOSIS — E6609 Other obesity due to excess calories: Secondary | ICD-10-CM

## 2016-07-01 DIAGNOSIS — R8271 Bacteriuria: Secondary | ICD-10-CM

## 2016-07-01 NOTE — Progress Notes (Signed)
   PRENATAL VISIT NOTE  Subjective:  Katherine Preston is a 21 y.o. G1P0000 at 684w4d being seen today for ongoing prenatal care.  She is currently monitored for the following issues for this low-risk pregnancy and has Obese; Supervision of normal pregnancy, antepartum; and GBS bacteriuria on her problem list.  Patient reports no complaints.  Contractions: Not present. Vag. Bleeding: None.  Movement: Absent. Denies leaking of fluid.   The following portions of the patient's history were reviewed and updated as appropriate: allergies, current medications, past family history, past medical history, past social history, past surgical history and problem list. Problem list updated.  Objective:   Vitals:   07/01/16 1140  BP: 108/76  Pulse: 93  Weight: 252 lb (114.3 kg)    Fetal Status: Fetal Heart Rate (bpm): 142   Movement: Absent     General:  Alert, oriented and cooperative. Patient is in no acute distress.  Skin: Skin is warm and dry. No rash noted.   Cardiovascular: Normal heart rate noted  Respiratory: Normal respiratory effort, no problems with respiration noted  Abdomen: Soft, gravid, appropriate for gestational age. Pain/Pressure: Present     Pelvic:  Cervical exam deferred        Extremities: Normal range of motion.     Mental Status: Normal mood and affect. Normal behavior. Normal judgment and thought content.   Assessment and Plan:  Pregnancy: G1P0000 at 554w4d  1. Supervision of normal first pregnancy, antepartum     Doing well.  US results not in system yet. Has us scheduled for 07/08/16.  - AFP, Serum, Open Spina Bifida  2. Class 3 obesity due to excess calories without serious comorbidity with body mass index (BMI) of 45.0 to 49.9 in adult (HCC)        3. GBS bacteriuria     PCN in labor  Preterm labor symptoms and general obstetric precautions including but not limited to vaginal bleeding, contractions, leaking of fluid and fetal movement were reviewed in detail  with the patient. Please refer to After Visit Summary for other counseling recommendations.  No Follow-up on file.   Roe Coombsachelle A Khiry Pasquariello, CNM

## 2016-07-08 ENCOUNTER — Other Ambulatory Visit: Payer: Self-pay | Admitting: Certified Nurse Midwife

## 2016-07-08 ENCOUNTER — Ambulatory Visit (HOSPITAL_COMMUNITY): Admission: RE | Admit: 2016-07-08 | Payer: BLUE CROSS/BLUE SHIELD | Source: Ambulatory Visit

## 2016-07-08 DIAGNOSIS — O99210 Obesity complicating pregnancy, unspecified trimester: Secondary | ICD-10-CM

## 2016-07-08 DIAGNOSIS — Z3400 Encounter for supervision of normal first pregnancy, unspecified trimester: Secondary | ICD-10-CM

## 2016-07-08 DIAGNOSIS — Z363 Encounter for antenatal screening for malformations: Secondary | ICD-10-CM

## 2016-07-08 DIAGNOSIS — Z3A18 18 weeks gestation of pregnancy: Secondary | ICD-10-CM

## 2016-07-08 LAB — AFP, SERUM, OPEN SPINA BIFIDA
AFP MOM: 1.46
AFP Value: 53.4 ng/mL
Gest. Age on Collection Date: 18.4 weeks
MATERNAL AGE AT EDD: 23.2 a
OSBR Risk 1 IN: 6095
Test Results:: NEGATIVE
Weight: 252 [lb_av]

## 2016-07-13 ENCOUNTER — Other Ambulatory Visit: Payer: Self-pay | Admitting: *Deleted

## 2016-07-13 DIAGNOSIS — Z3492 Encounter for supervision of normal pregnancy, unspecified, second trimester: Secondary | ICD-10-CM

## 2016-07-13 NOTE — Progress Notes (Signed)
Correction made for previous u/s order.

## 2016-07-15 ENCOUNTER — Encounter (HOSPITAL_COMMUNITY): Payer: Self-pay | Admitting: *Deleted

## 2016-07-15 ENCOUNTER — Inpatient Hospital Stay (HOSPITAL_COMMUNITY)
Admission: AD | Admit: 2016-07-15 | Discharge: 2016-07-15 | Disposition: A | Discharge status: Home / Self Care | Payer: BLUE CROSS/BLUE SHIELD | Source: Ambulatory Visit | Attending: Obstetrics and Gynecology | Admitting: Obstetrics and Gynecology

## 2016-07-15 DIAGNOSIS — O26899 Other specified pregnancy related conditions, unspecified trimester: Secondary | ICD-10-CM

## 2016-07-15 DIAGNOSIS — R300 Dysuria: Secondary | ICD-10-CM | POA: Insufficient documentation

## 2016-07-15 DIAGNOSIS — O26892 Other specified pregnancy related conditions, second trimester: Secondary | ICD-10-CM | POA: Diagnosis not present

## 2016-07-15 DIAGNOSIS — M549 Dorsalgia, unspecified: Secondary | ICD-10-CM | POA: Diagnosis not present

## 2016-07-15 DIAGNOSIS — Z3A20 20 weeks gestation of pregnancy: Secondary | ICD-10-CM | POA: Insufficient documentation

## 2016-07-15 DIAGNOSIS — O99891 Other specified diseases and conditions complicating pregnancy: Secondary | ICD-10-CM

## 2016-07-15 DIAGNOSIS — R109 Unspecified abdominal pain: Secondary | ICD-10-CM | POA: Diagnosis present

## 2016-07-15 DIAGNOSIS — O9989 Other specified diseases and conditions complicating pregnancy, childbirth and the puerperium: Secondary | ICD-10-CM

## 2016-07-15 DIAGNOSIS — R8271 Bacteriuria: Secondary | ICD-10-CM

## 2016-07-15 LAB — URINALYSIS, ROUTINE W REFLEX MICROSCOPIC
Bilirubin Urine: NEGATIVE
GLUCOSE, UA: NEGATIVE mg/dL
Hgb urine dipstick: NEGATIVE
Ketones, ur: 20 mg/dL — AB
NITRITE: NEGATIVE
PH: 6 (ref 5.0–8.0)
PROTEIN: NEGATIVE mg/dL
SPECIFIC GRAVITY, URINE: 1.021 (ref 1.005–1.030)

## 2016-07-15 LAB — WET PREP, GENITAL
CLUE CELLS WET PREP: NONE SEEN
Sperm: NONE SEEN
Trich, Wet Prep: NONE SEEN
Yeast Wet Prep HPF POC: NONE SEEN

## 2016-07-15 LAB — OB RESULTS CONSOLE GC/CHLAMYDIA: Gonorrhea: NEGATIVE

## 2016-07-15 MED ORDER — ACETAMINOPHEN 500 MG PO TABS
1000.0000 mg | ORAL_TABLET | Freq: Four times a day (QID) | ORAL | Status: DC | PRN
  Administered 2016-07-15: 1000 mg via ORAL
  Filled 2016-07-15: qty 2

## 2016-07-15 MED ORDER — NITROFURANTOIN MONOHYD MACRO 100 MG PO CAPS: 100.0000 mg | ORAL_CAPSULE | Freq: Two times a day (BID) | ORAL | 0 refills | Status: AC

## 2016-07-15 NOTE — Discharge Instructions (Signed)
Pregnancy and Urinary Tract Infection °WHAT IS A URINARY TRACT INFECTION? °A urinary tract infection (UTI) is an infection of any part of the urinary tract. This includes the kidneys, the tubes that connect your kidneys to your bladder (ureters), the bladder, and the tube that carries urine out of your body (urethra). These organs make, store, and get rid of urine in the body. A UTI can be a bladder infection (cystitis) or a kidney infection (pyelonephritis). This infection may be caused by fungi, viruses, and bacteria. Bacteria are the most common cause of UTIs. °You are more likely to develop a UTI during pregnancy because: °· The physical and hormonal changes your body goes through can make it easier for bacteria to get into your urinary tract. °· Your growing baby puts pressure on your uterus and can affect urine flow. °DOES A UTI PLACE MY BABY AT RISK? °An untreated UTI during pregnancy could lead to a kidney infection, which can cause health problems that could affect your baby. Possible complications of an untreated UTI include: °· Having your baby before 37 weeks of pregnancy (premature). °· Having a baby with a low birth weight. °· Developing high blood pressure during pregnancy (preeclampsia). °WHAT ARE THE SYMPTOMS OF A UTI? °Symptoms of a UTI include: °· Fever. °· Frequent urination or passing small amounts of urine frequently. °· Needing to urinate urgently. °· Pain or a burning sensation with urination. °· Urine that smells bad or unusual. °· Cloudy urine. °· Pain in the lower abdomen or back. °· Trouble urinating. °· Blood in the urine. °· Vomiting or being less hungry than normal. °· Diarrhea or abdominal pain. °· Vaginal discharge. °WHAT ARE THE TREATMENT OPTIONS FOR A UTI DURING PREGNANCY? °Treatment for this condition may include: °· Antibiotic medicines that are safe to take during pregnancy. °· Other medicines to treat less common causes of UTI. °HOW CAN I PREVENT A UTI? °To prevent a UTI: °· Go  to the bathroom as soon as you feel the need. °· Always wipe from front to back. °· Wash your genital area with soap and warm water daily. °· Empty your bladder before and after sex. °· Wear cotton underwear. °· Limit your intake of high sugar foods or drinks, such as regular soda, juice, and sweets.. °· Drink 6-8 glasses of water daily. °· Do not wear tight-fitting pants. °· Do not douche or use deodorant sprays. °· Do not drink alcohol, caffeine, or carbonated drinks. These can irritate the bladder. °WHEN SHOULD I SEEK MEDICAL CARE? °Seek medical care if: °· Your symptoms do not improve or get worse. °· You have a fever after two days of treatment. °· You have a rash. °· You have abnormal vaginal discharge. °· You have back or side pain. °· You have chills. °· You have nausea and vomiting. °WHEN SHOULD I SEEK IMMEDIATE MEDICAL CARE? °Seek immediate medical care if you are pregnant and: °· You feel contractions in your uterus. °· You have lower belly pain. °· You have a gush of fluid from your vagina. °· You have blood in your urine. °· You are vomiting and cannot keep down any medicines or water. °This information is not intended to replace advice given to you by your health care provider. Make sure you discuss any questions you have with your health care provider. °Document Released: 09/04/2010 Document Revised: 10/13/2015 Document Reviewed: 03/31/2015 °Elsevier Interactive Patient Education © 2017 [employer] ° °

## 2016-07-15 NOTE — MAU Provider Note (Signed)
History     CSN: [id number]  Arrival date and time: 07/15/16 [id number]   First Provider Initiated Contact with Patient 07/15/16 1652      No chief complaint on file.  G1 @20 .4 weeks here with back pain and abd pain x2 days. She describes the back pain as throbbing and intermittent. She describes the abd pain as lower, intermittent, cramping and pressure. She has not used anything for the pain. She denies aggravating or alleviating factors. She denies VB but reports white/yellow vaginal discharge x2 days. Denies itching or malodor. Denies new sexual partner. No new skin products or detergent. She reports lower abdominal pain also occurs with voiding. She reports urinary frequency and orange colored urine.    OB History    Gravida Para Term Preterm AB Living   1 0 0 0 0 0   SAB TAB Ectopic Multiple Live Births   0 0 0 0        Past Medical History:  Diagnosis Date  . Allergic rhinitis   . Anxiety    per patient, no diagnosis  . Sleep apnea     Past Surgical History:  Procedure Laterality Date  . NO PAST SURGERIES      Family History  Problem Relation Age of Onset  . Fibroids Mother     Social History  Substance Use Topics  . Smoking status: Never Smoker  . Smokeless tobacco: Never Used  . Alcohol use 0.0 oz/week     Comment: Occassional -not with pregnancy    Allergies:  Allergies  Allergen Reactions  . Pollen Extract     Prescriptions Prior to Admission  Medication Sig Dispense Refill Last Dose  . butalbital-acetaminophen-caffeine (FIORICET, ESGIC) 50-325-40 MG tablet Take 2 tablets by mouth every 6 (six) hours as needed for headache. 45 tablet 5 Past Week at Unknown time    Review of Systems  Constitutional: Negative for fever.  Gastrointestinal: Positive for abdominal pain.  Genitourinary: Positive for dysuria, frequency and vaginal discharge. Negative for hematuria, urgency and vaginal bleeding.  Musculoskeletal: Positive for back pain.   Physical Exam    Blood pressure 116/62, pulse 81, temperature 98.8 F (37.1 C), temperature source Oral, resp. rate 19, last menstrual period 02/22/2016, SpO2 98 %.  Physical Exam  Constitutional: She is oriented to person, place, and time. She appears well-developed and well-nourished. No distress.  HENT:  Head: Normocephalic and atraumatic.  Neck: Normal range of motion.  Cardiovascular: Normal rate.   Respiratory: Effort normal.  GI: Soft. She exhibits no distension and no mass. There is tenderness (suprapubic). There is no rebound, no guarding and no CVA tenderness.  Genitourinary:  Genitourinary Comments: External: no lesions or erythema Vagina: rugatednulli, thin white discharge SVE: closed/long   Musculoskeletal: Normal range of motion.       Thoracic back: She exhibits no tenderness.       Lumbar back: She exhibits no tenderness.  Neurological: She is alert and oriented to person, place, and time.  Skin: Skin is warm and dry.  Psychiatric: She has a normal mood and affect.   FHT: 156 bpm  Results for orders placed or performed during the hospital encounter of 07/15/16 (from the past 24 hour(s))  Urinalysis, Routine w reflex microscopic     Status: Abnormal   Collection Time: 07/15/16  4:19 PM  Result Value Ref Range   Color, Urine YELLOW YELLOW   APPearance HAZY (A) CLEAR   Specific Gravity, Urine 1.021 1.005 - 1.030   pH  6.0 5.0 - 8.0   Glucose, UA NEGATIVE NEGATIVE mg/dL   Hgb urine dipstick NEGATIVE NEGATIVE   Bilirubin Urine NEGATIVE NEGATIVE   Ketones, ur 20 (A) NEGATIVE mg/dL   Protein, ur NEGATIVE NEGATIVE mg/dL   Nitrite NEGATIVE NEGATIVE   Leukocytes, UA MODERATE (A) NEGATIVE   RBC / HPF 0-5 0 - 5 RBC/hpf   WBC, UA 6-30 0 - 5 WBC/hpf   Bacteria, UA RARE (A) NONE SEEN   Squamous Epithelial / LPF 0-5 (A) NONE SEEN   Mucous PRESENT   Wet prep, genital     Status: Abnormal   Collection Time: 07/15/16  5:01 PM  Result Value Ref Range   Yeast Wet Prep HPF POC NONE SEEN  NONE SEEN   Trich, Wet Prep NONE SEEN NONE SEEN   Clue Cells Wet Prep HPF POC NONE SEEN NONE SEEN   WBC, Wet Prep HPF POC MODERATE (A) NONE SEEN   Sperm NONE SEEN     MAU Course  Procedures  MDM Labs ordered and reviewed. Will treat UTI. UC sent. No evidence of PTL. Pain improved after Tylenol. Stable for discharge home.   Assessment and Plan   1. [redacted] weeks gestation of pregnancy   2. GBS bacteriuria   3. Abdominal cramping affecting pregnancy   4. Back pain affecting pregnancy in second trimester   5. Dysuria during pregnancy in second trimester    Discharge home Follow up at [facility] as scheduled Rx Macrobid Increase water intake Cranberry juice  Allergies as of 07/15/2016      Reactions   Pollen Extract       Medication List    TAKE these medications   butalbital-acetaminophen-caffeine 50-325-40 MG tablet Commonly known as:  FIORICET, ESGIC Take 2 tablets by mouth every 6 (six) hours as needed for headache.   nitrofurantoin (macrocrystal-monohydrate) 100 MG capsule Commonly known as:  MACROBID Take 1 capsule (100 mg total) by mouth 2 (two) times daily.      Donette Larry, CNM 07/15/2016, 4:53 PM

## 2016-07-15 NOTE — MAU Note (Signed)
Pt reports pain in her lower abd, lower back for 3 days, denies bleeding or ROM. Reports some dysuria.

## 2016-07-16 LAB — GC/CHLAMYDIA PROBE AMP (~~LOC~~) NOT AT ARMC
CHLAMYDIA, DNA PROBE: NEGATIVE
Neisseria Gonorrhea: NEGATIVE

## 2016-07-17 LAB — CULTURE, OB URINE

## 2016-07-20 ENCOUNTER — Inpatient Hospital Stay (HOSPITAL_COMMUNITY)
Admission: AD | Admit: 2016-07-20 | Discharge: 2016-07-21 | Disposition: A | Discharge status: Home / Self Care | Payer: BLUE CROSS/BLUE SHIELD | Source: Ambulatory Visit | Attending: Family Medicine | Admitting: Family Medicine

## 2016-07-20 DIAGNOSIS — Z3A22 22 weeks gestation of pregnancy: Secondary | ICD-10-CM | POA: Diagnosis not present

## 2016-07-20 DIAGNOSIS — O26892 Other specified pregnancy related conditions, second trimester: Secondary | ICD-10-CM | POA: Diagnosis not present

## 2016-07-20 DIAGNOSIS — R12 Heartburn: Secondary | ICD-10-CM | POA: Diagnosis not present

## 2016-07-20 NOTE — MAU Note (Signed)
Pt c/o abdominal pain and epigastric pain that started around 11pm. Has some nausea but no vomiting. Unsure if this is heartburn.Took Tylenol around 11pm-has not helped. Is currently on antibiotic for UTI. Denies vag bleeding. FHR in triage 138.

## 2016-07-21 ENCOUNTER — Encounter (HOSPITAL_COMMUNITY): Payer: Self-pay

## 2016-07-21 DIAGNOSIS — R12 Heartburn: Secondary | ICD-10-CM

## 2016-07-21 DIAGNOSIS — O26892 Other specified pregnancy related conditions, second trimester: Secondary | ICD-10-CM

## 2016-07-21 LAB — URINALYSIS, ROUTINE W REFLEX MICROSCOPIC
Bilirubin Urine: NEGATIVE
Glucose, UA: NEGATIVE mg/dL
HGB URINE DIPSTICK: NEGATIVE
KETONES UR: NEGATIVE mg/dL
Leukocytes, UA: NEGATIVE
Nitrite: NEGATIVE
PROTEIN: NEGATIVE mg/dL
Specific Gravity, Urine: 1.002 — ABNORMAL LOW (ref 1.005–1.030)
Squamous Epithelial / LPF: NONE SEEN
pH: 7 (ref 5.0–8.0)

## 2016-07-21 MED ORDER — GI COCKTAIL ~~LOC~~
30.0000 mL | Freq: Once | ORAL | Status: AC
  Administered 2016-07-21: 30 mL via ORAL
  Filled 2016-07-21: qty 30

## 2016-07-21 MED ORDER — FAMOTIDINE 20 MG PO TABS: 20.0000 mg | ORAL_TABLET | Freq: Two times a day (BID) | ORAL | 1 refills | Status: DC

## 2016-07-21 NOTE — MAU Provider Note (Signed)
History   Patient Katherine Preston is a [age] yeaer old G1P0 here with complaints of  Heart burn that started at 11 pm this evening. She was recently diagnosed with a UTI and started on Macrobid. She is due to finish her macrobid on 07-21-2016. She denies bleeding or leaking of fluid, does not yet feel fetal movements (22 weeks).  CSN: [id number]  Arrival date and time: 07/20/16 2349   None     Chief Complaint  Patient presents with  . Abdominal Pain   Abdominal Pain  This is a new problem. The current episode started today. The onset quality is sudden. The pain is located in the epigastric region. The pain is at a severity of 8/10. The quality of the pain is burning. Pain radiation: spread up her chest  Associated symptoms include nausea. Pertinent negatives include no vomiting. The pain is aggravated by bowel movement. She has tried nothing for the symptoms.   She has had sandwich, [employer] sausage burrito and coffee today to eat. She says that the abdominal pain is daily and usually comes on after she has a bowel movements. She had a bowe; movement tonight at 11 and that is when the pain started.   OB History    Gravida Para Term Preterm AB Living   1 0 0 0 0 0   SAB TAB Ectopic Multiple Live Births   0 0 0 0        Past Medical History:  Diagnosis Date  . Allergic rhinitis   . Anxiety    per patient, no diagnosis  . Sleep apnea     Past Surgical History:  Procedure Laterality Date  . NO PAST SURGERIES      Family History  Problem Relation Age of Onset  . Fibroids Mother     Social History  Substance Use Topics  . Smoking status: Never Smoker  . Smokeless tobacco: Never Used  . Alcohol use 0.0 oz/week     Comment: Occassional -not with pregnancy    Allergies:  Allergies  Allergen Reactions  . Pollen Extract     Prescriptions Prior to Admission  Medication Sig Dispense Refill Last Dose  . butalbital-acetaminophen-caffeine (FIORICET, ESGIC) 50-325-40 MG  tablet Take 2 tablets by mouth every 6 (six) hours as needed for headache. 45 tablet 5 Past Week at Unknown time  . nitrofurantoin, macrocrystal-monohydrate, (MACROBID) 100 MG capsule Take 1 capsule (100 mg total) by mouth 2 (two) times daily. 14 capsule 0     Review of Systems  Constitutional: Negative.   HENT: Negative.   Eyes: Negative.   Respiratory: Negative.   Cardiovascular: Negative.   Gastrointestinal: Positive for abdominal pain and nausea. Negative for vomiting.  Endocrine: Negative.   Genitourinary: Negative.   Musculoskeletal: Negative.   Skin: Negative.   Allergic/Immunologic: Negative.   Neurological: Negative.    Physical Exam   Blood pressure 113/64, pulse 71, temperature 97.6 F (36.4 C), temperature source Oral, resp. rate 20, last menstrual period 02/22/2016, SpO2 97 %.  Physical Exam  Constitutional: She is oriented to person, place, and time. She appears well-developed.  HENT:  Head: Normocephalic.  Neck: Normal range of motion.  Respiratory: Effort normal.  GI: Soft. Bowel sounds are normal. She exhibits no distension and no mass. There is tenderness. There is no rebound and no guarding.  Musculoskeletal: Normal range of motion.  Neurological: She is alert and oriented to person, place, and time.  Skin: Skin is warm and dry.  Psychiatric: She  has a normal mood and affect.    MAU Course  Procedures  MDM -GI cocktail: patient's pain is now gone. Patient is sleeping in bed with her mother at the bedside.  -FHR is 138 bpm.   Assessment and Plan   1. Heartburn during pregnancy in second trimester    2. Patient stable for discharge with RX for Pepcid. Recommended that patient increase her water intake, increase fruits and vegetables and decrease fat and salt in her diet. I also discussed the importance of eating small meals and separating drinking from eating by at least 30 minutes.  Patient and her mother verbalized understanding.   Charlesetta Garibaldi  [place] CNM 07/21/2016, 1:14 AM

## 2016-07-21 NOTE — Discharge Instructions (Signed)
Eating Plan for Pregnant Women While you are pregnant, your body will require additional nutrition to help support your growing baby. It is recommended that you consume:  150 additional calories each day during your first trimester.  300 additional calories each day during your second trimester.  300 additional calories each day during your third trimester. Eating a healthy, well-balanced diet is very important for your health and for your baby's health. You also have a higher need for some vitamins and minerals, such as folic acid, calcium, iron, and vitamin D. What do I need to know about eating during pregnancy?  Do not try to lose weight or go on a diet during pregnancy.  Choose healthy, nutritious foods. Choose  of a sandwich with a glass of milk instead of a candy bar or a high-calorie sugar-sweetened beverage.  Limit your overall intake of foods that have "empty calories." These are foods that have little nutritional value, such as sweets, desserts, candies, sugar-sweetened beverages, and fried foods.  Eat a variety of foods, especially fruits and vegetables.  Take a prenatal vitamin to help meet the additional needs during pregnancy, specifically for folic acid, iron, calcium, and vitamin D.  Remember to stay active. Ask your health care provider for exercise recommendations that are specific to you.  Practice good food safety and cleanliness, such as washing your hands before you eat and after you prepare raw meat. This helps to prevent foodborne illnesses, such as listeriosis, that can be very dangerous for your baby. Ask your health care provider for more information about listeriosis. What does 150 extra calories look like? Healthy options for an additional 150 calories each day could be any of the following:  Plain low-fat yogurt (6-8 oz) with  cup of berries.  1 apple with 2 teaspoons of peanut butter.  Cut-up vegetables with  cup of hummus.  Low-fat chocolate milk  (8 oz or 1 cup).  1 string cheese with 1 medium orange.   of a peanut butter and jelly sandwich on whole-wheat bread (1 tsp of peanut butter). For 300 calories, you could eat two of those healthy options each day. What is a healthy amount of weight to gain? The recommended amount of weight for you to gain is based on your pre-pregnancy BMI. If your pre-pregnancy BMI was:  Less than 18 (underweight), you should gain 28-40 lb.  18-24.9 (normal), you should gain 25-35 lb.  25-29.9 (overweight), you should gain 15-25 lb.  Greater than 30 (obese), you should gain 11-20 lb. What if I am having twins or multiples? Generally, pregnant women who will be having twins or multiples may need to increase their daily calories by 300-600 calories each day. The recommended range for total weight gain is 25-54 lb, depending on your pre-pregnancy BMI. Talk with your health care provider for specific guidance about additional nutritional needs, weight gain, and exercise during your pregnancy. What foods can I eat? Grains  Any grains. Try to choose whole grains, such as whole-wheat bread, oatmeal, or brown rice. Vegetables  Any vegetables. Try to eat a variety of colors and types of vegetables to get a full range of vitamins and minerals. Remember to wash your vegetables well before eating. Fruits  Any fruits. Try to eat a variety of colors and types of fruit to get a full range of vitamins and minerals. Remember to wash your fruits well before eating. Meats and Other Protein Sources  Lean meats, including chicken, turkey, fish, and lean cuts of beef,   veal, or pork. Make sure that all meats are cooked to "well done." Tofu. Tempeh. Beans. Eggs. Peanut butter and other nut butters. Seafood, such as shrimp, crab, and lobster. If you choose fish, select types that are higher in omega-3 fatty acids, including salmon, herring, mussels, trout, sardines, and pollock. Make sure that all meats are cooked to food-safe  temperatures. Dairy  Pasteurized milk and milk alternatives. Pasteurized yogurt and pasteurized cheese. Cottage cheese. Sour cream. Beverages  Water. Juices that contain 100% fruit juice or vegetable juice. Caffeine-free teas and decaffeinated coffee. Drinks that contain caffeine are okay to drink, but it is better to avoid caffeine. Keep your total caffeine intake to less than 200 mg each day (12 oz of coffee, tea, or soda) or as directed by your health care provider. Condiments  Any pasteurized condiments. Sweets and Desserts  Any sweets and desserts. Fats and Oils  Any fats and oils. The items listed above may not be a complete list of recommended foods or beverages. Contact your dietitian for more options.  What foods are not recommended? Vegetables  Unpasteurized (raw) vegetable juices. Fruits  Unpasteurized (raw) fruit juices. Meats and Other Protein Sources  Cured meats that have nitrates, such as bacon, salami, and hotdogs. Luncheon meats, bologna, or other deli meats (unless they are reheated until they are steaming hot). Refrigerated pate, meat spreads from a meat counter, smoked seafood that is found in the refrigerated section of a store. Raw fish, such as sushi or sashimi. High mercury content fish, such as tilefish, shark, swordfish, and king mackerel. Raw meats, such as tuna or beef tartare. Undercooked meats and poultry. Make sure that all meats are cooked to food-safe temperatures. Dairy  Unpasteurized (raw) milk and any foods that have raw milk in them. Soft cheeses, such as feta, queso blanco, queso fresco, Brie, Camembert cheeses, blue-veined cheeses, and Panela cheese (unless it is made with pasteurized milk, which must be stated on the label). Beverages  Alcohol. Sugar-sweetened beverages, such as sodas, teas, or energy drinks. Condiments  Homemade fermented foods and drinks, such as pickles, sauerkraut, or kombucha drinks. (Store-bought pasteurized versions of these are  okay.) Other  Salads that are made in the store, such as ham salad, chicken salad, egg salad, tuna salad, and seafood salad. The items listed above may not be a complete list of foods and beverages to avoid. Contact your dietitian for more information.  This information is not intended to replace advice given to you by your health care provider. Make sure you discuss any questions you have with your health care provider. Document Released: 02/22/2014 Document Revised: 10/16/2015 Document Reviewed: 10/23/2013 Elsevier Interactive Patient Education  2017 [employer]  

## 2016-07-28 ENCOUNTER — Inpatient Hospital Stay (HOSPITAL_COMMUNITY)
Admission: AD | Admit: 2016-07-28 | Discharge: 2016-07-29 | Disposition: A | Discharge status: Home / Self Care | Payer: BLUE CROSS/BLUE SHIELD | Source: Ambulatory Visit | Attending: Obstetrics & Gynecology | Admitting: Obstetrics & Gynecology

## 2016-07-28 ENCOUNTER — Ambulatory Visit (HOSPITAL_COMMUNITY)
Admission: RE | Admit: 2016-07-28 | Discharge: 2016-07-28 | Disposition: A | Discharge status: Home / Self Care | Payer: BLUE CROSS/BLUE SHIELD | Source: Ambulatory Visit | Attending: Certified Nurse Midwife | Admitting: Certified Nurse Midwife

## 2016-07-28 ENCOUNTER — Other Ambulatory Visit: Payer: Self-pay | Admitting: Certified Nurse Midwife

## 2016-07-28 ENCOUNTER — Encounter (HOSPITAL_COMMUNITY): Payer: Self-pay | Admitting: *Deleted

## 2016-07-28 ENCOUNTER — Encounter (HOSPITAL_COMMUNITY): Payer: Self-pay

## 2016-07-28 DIAGNOSIS — R1011 Right upper quadrant pain: Secondary | ICD-10-CM | POA: Insufficient documentation

## 2016-07-28 DIAGNOSIS — Z3A18 18 weeks gestation of pregnancy: Secondary | ICD-10-CM

## 2016-07-28 DIAGNOSIS — R8271 Bacteriuria: Secondary | ICD-10-CM

## 2016-07-28 DIAGNOSIS — Z0489 Encounter for examination and observation for other specified reasons: Secondary | ICD-10-CM

## 2016-07-28 DIAGNOSIS — Z362 Encounter for other antenatal screening follow-up: Secondary | ICD-10-CM | POA: Insufficient documentation

## 2016-07-28 DIAGNOSIS — O99212 Obesity complicating pregnancy, second trimester: Secondary | ICD-10-CM

## 2016-07-28 DIAGNOSIS — Z3492 Encounter for supervision of normal pregnancy, unspecified, second trimester: Secondary | ICD-10-CM

## 2016-07-28 DIAGNOSIS — IMO0002 Reserved for concepts with insufficient information to code with codable children: Secondary | ICD-10-CM

## 2016-07-28 DIAGNOSIS — O26892 Other specified pregnancy related conditions, second trimester: Secondary | ICD-10-CM | POA: Insufficient documentation

## 2016-07-28 DIAGNOSIS — O99210 Obesity complicating pregnancy, unspecified trimester: Secondary | ICD-10-CM

## 2016-07-28 DIAGNOSIS — Z3A22 22 weeks gestation of pregnancy: Secondary | ICD-10-CM | POA: Insufficient documentation

## 2016-07-28 LAB — CBC WITH DIFFERENTIAL/PLATELET
BASOS PCT: 0 %
Basophils Absolute: 0 10*3/uL (ref 0.0–0.1)
Eosinophils Absolute: 0.2 10*3/uL (ref 0.0–0.7)
Eosinophils Relative: 2 %
HEMATOCRIT: 33.9 % — AB (ref 36.0–46.0)
HEMOGLOBIN: 11.4 g/dL — AB (ref 12.0–15.0)
Lymphocytes Relative: 15 %
Lymphs Abs: 2.3 10*3/uL (ref 0.7–4.0)
MCH: 25.4 pg — ABNORMAL LOW (ref 26.0–34.0)
MCHC: 33.6 g/dL (ref 30.0–36.0)
MCV: 75.7 fL — ABNORMAL LOW (ref 78.0–100.0)
MONOS PCT: 5 %
Monocytes Absolute: 0.7 10*3/uL (ref 0.1–1.0)
NEUTROS ABS: 11.7 10*3/uL — AB (ref 1.7–7.7)
NEUTROS PCT: 78 %
Platelets: 241 10*3/uL (ref 150–400)
RBC: 4.48 MIL/uL (ref 3.87–5.11)
RDW: 15 % (ref 11.5–15.5)
WBC: 15 10*3/uL — ABNORMAL HIGH (ref 4.0–10.5)

## 2016-07-28 LAB — URINALYSIS, ROUTINE W REFLEX MICROSCOPIC
BILIRUBIN URINE: NEGATIVE
Glucose, UA: 50 mg/dL — AB
HGB URINE DIPSTICK: NEGATIVE
Ketones, ur: NEGATIVE mg/dL
NITRITE: NEGATIVE
Protein, ur: NEGATIVE mg/dL
SPECIFIC GRAVITY, URINE: 1.021 (ref 1.005–1.030)
pH: 6 (ref 5.0–8.0)

## 2016-07-28 LAB — COMPREHENSIVE METABOLIC PANEL
ALK PHOS: 66 U/L (ref 38–126)
ALT: 31 U/L (ref 14–54)
ANION GAP: 9 (ref 5–15)
AST: 64 U/L — ABNORMAL HIGH (ref 15–41)
Albumin: 3.1 g/dL — ABNORMAL LOW (ref 3.5–5.0)
BUN: <5 mg/dL — ABNORMAL LOW (ref 6–20)
CALCIUM: 8.8 mg/dL — AB (ref 8.9–10.3)
CO2: 23 mmol/L (ref 22–32)
Chloride: 102 mmol/L (ref 101–111)
Creatinine, Ser: 0.59 mg/dL (ref 0.44–1.00)
GFR calc non Af Amer: >60 mL/min (ref >60)
Glucose, Bld: 107 mg/dL — ABNORMAL HIGH (ref 65–99)
Potassium: 3.6 mmol/L (ref 3.5–5.1)
SODIUM: 134 mmol/L — AB (ref 135–145)
TOTAL PROTEIN: 6.9 g/dL (ref 6.5–8.1)
Total Bilirubin: 0.1 mg/dL — ABNORMAL LOW (ref 0.3–1.2)

## 2016-07-28 LAB — AMYLASE: AMYLASE: 70 U/L (ref 28–100)

## 2016-07-28 LAB — LIPASE, BLOOD: Lipase: 31 U/L (ref 11–51)

## 2016-07-28 MED ORDER — ONDANSETRON HCL 4 MG PO TABS
8.0000 mg | ORAL_TABLET | Freq: Once | ORAL | Status: AC
  Administered 2016-07-28: 8 mg via ORAL
  Filled 2016-07-28: qty 2

## 2016-07-28 MED ORDER — TRAMADOL HCL 50 MG PO TABS
50.0000 mg | ORAL_TABLET | Freq: Once | ORAL | Status: AC
  Administered 2016-07-28: 50 mg via ORAL
  Filled 2016-07-28: qty 1

## 2016-07-28 MED ORDER — TRAMADOL HCL 50 MG PO TABS: 50.0000 mg | ORAL_TABLET | Freq: Four times a day (QID) | ORAL | 0 refills | Status: DC | PRN

## 2016-07-28 MED ORDER — GI COCKTAIL ~~LOC~~
30.0000 mL | Freq: Once | ORAL | Status: AC
  Administered 2016-07-28: 30 mL via ORAL
  Filled 2016-07-28: qty 30

## 2016-07-28 NOTE — MAU Provider Note (Signed)
Chief Complaint: No chief complaint on file.   First Provider Initiated Contact with Patient 07/28/16 2103     SUBJECTIVE HPI: Katherine Preston is a 21 y.o. G1P0000 at [redacted]w[redacted]d who presents to Maternity Admissions reporting RUQ pain since this afternoon. Pt seen by MFM for [place] and later went home with a discomfort able sensation becoming sharp radiating to back. Denies contractions, leakage of fluid or vaginal bleeding. Reports good fetal movement. Pt states she was suppose to have a gallbladder workup prior to pregnancy from PCP but never completed.  Location: RUQ Quality: Sharp Severity: 10/10 in pain scale Duration: Intermitent Context: Pregnant, gradual onset Timing: this afternoon Modifying factors: worse when supine Associated signs and symptoms: nausea w/ vomiting  Pregnancy Course:   Past Medical History:  Diagnosis Date  . Allergic rhinitis   . Anxiety    per patient, no diagnosis  . Sleep apnea    OB History  Gravida Para Term Preterm AB Living  1 0 0 0 0 0  SAB TAB Ectopic Multiple Live Births  0 0 0 0      # Outcome Date GA Lbr Len/2nd Weight Sex Delivery Anes PTL Lv  1 Current              Past Surgical History:  Procedure Laterality Date  . NO PAST SURGERIES     Family History  Problem Relation Age of Onset  . Fibroids Mother    Social History  Substance Use Topics  . Smoking status: Never Smoker  . Smokeless tobacco: Never Used  . Alcohol use 0.0 oz/week     Comment: Occassional -not with pregnancy   No Known Allergies No prescriptions prior to admission.    I have reviewed patient's Past Medical Hx, Surgical Hx, Family Hx, Social Hx, medications and allergies.   ROS:  Review of Systems  Constitutional: Negative for chills, diaphoresis and fever.  HENT: Negative for congestion and sore throat.   Respiratory: Positive for shortness of breath. Negative for cough and wheezing.   Cardiovascular: Negative for chest pain, palpitations and leg  swelling.  Gastrointestinal: Positive for nausea and vomiting. Negative for abdominal pain, constipation and diarrhea.  Genitourinary: Negative for dysuria, frequency, hematuria, urgency, vaginal bleeding, vaginal discharge and vaginal pain.  Musculoskeletal: Negative for back pain and neck pain.  Neurological: Negative for light-headedness and headaches.    Physical Exam   Patient Vitals for the past 24 hrs:  BP Temp Temp src Pulse Resp SpO2  07/29/16 0016 106/71 97.8 F (36.6 C) Oral - - -  07/28/16 2043 138/77 97.9 F (36.6 C) Oral 102 22 100 %   Constitutional: Well-developed, well-nourished female in no acute distress.  Cardiovascular: normal rate Respiratory: normal effort GI: Abd soft, non-tender, gravid appropriate for gestational age. Pos BS x 4 MS: Extremities nontender, no edema, normal ROM Neurologic: Alert and oriented x 4.  GU: Neg CVAT.  Pelvic: NEFG, physiologic discharge, no blood, cervix clean. No CMT     FHT:  [redacted]w[redacted]d Contractions: none   Labs: Results for orders placed or performed during the hospital encounter of 07/28/16 (from the past 24 hour(s))  Urinalysis, Routine w reflex microscopic     Status: Abnormal   Collection Time: 07/28/16  9:47 PM  Result Value Ref Range   Color, Urine YELLOW YELLOW   APPearance CLEAR CLEAR   Specific Gravity, Urine 1.021 1.005 - 1.030   pH 6.0 5.0 - 8.0   Glucose, UA 50 (A) NEGATIVE mg/dL  Hgb urine dipstick NEGATIVE NEGATIVE   Bilirubin Urine NEGATIVE NEGATIVE   Ketones, ur NEGATIVE NEGATIVE mg/dL   Protein, ur NEGATIVE NEGATIVE mg/dL   Nitrite NEGATIVE NEGATIVE   Leukocytes, UA SMALL (A) NEGATIVE   RBC / HPF 0-5 0 - 5 RBC/hpf   WBC, UA 6-30 0 - 5 WBC/hpf   Bacteria, UA RARE (A) NONE SEEN   Squamous Epithelial / LPF 0-5 (A) NONE SEEN   Mucous PRESENT   Comprehensive metabolic panel     Status: Abnormal   Collection Time: 07/28/16 10:02 PM  Result Value Ref Range   Sodium 134 (L) 135 - 145 mmol/L   Potassium  3.6 3.5 - 5.1 mmol/L   Chloride 102 101 - 111 mmol/L   CO2 23 22 - 32 mmol/L   Glucose, Bld 107 (H) 65 - 99 mg/dL   BUN <5 (L) 6 - 20 mg/dL   Creatinine, Ser 4.09 0.44 - 1.00 mg/dL   Calcium 8.8 (L) 8.9 - 10.3 mg/dL   Total Protein 6.9 6.5 - 8.1 g/dL   Albumin 3.1 (L) 3.5 - 5.0 g/dL   AST 64 (H) 15 - 41 U/L   ALT 31 14 - 54 U/L   Alkaline Phosphatase 66 38 - 126 U/L   Total Bilirubin 0.1 (L) 0.3 - 1.2 mg/dL   GFR calc non Af Amer >60 >60 mL/min   GFR calc Af Amer >60 >60 mL/min   Anion gap 9 5 - 15  CBC with Differential/Platelet     Status: Abnormal   Collection Time: 07/28/16 10:02 PM  Result Value Ref Range   WBC 15.0 (H) 4.0 - 10.5 K/uL   RBC 4.48 3.87 - 5.11 MIL/uL   Hemoglobin 11.4 (L) 12.0 - 15.0 g/dL   HCT 81.1 (L) 91.4 - 78.2 %   MCV 75.7 (L) 78.0 - 100.0 fL   MCH 25.4 (L) 26.0 - 34.0 pg   MCHC 33.6 30.0 - 36.0 g/dL   RDW 95.6 21.3 - 08.6 %   Platelets 241 150 - 400 K/uL   Neutrophils Relative % 78 %   Neutro Abs 11.7 (H) 1.7 - 7.7 K/uL   Lymphocytes Relative 15 %   Lymphs Abs 2.3 0.7 - 4.0 K/uL   Monocytes Relative 5 %   Monocytes Absolute 0.7 0.1 - 1.0 K/uL   Eosinophils Relative 2 %   Eosinophils Absolute 0.2 0.0 - 0.7 K/uL   Basophils Relative 0 %   Basophils Absolute 0.0 0.0 - 0.1 K/uL  Lipase, blood     Status: None   Collection Time: 07/28/16 10:02 PM  Result Value Ref Range   Lipase 31 11 - 51 U/L  Amylase     Status: None   Collection Time: 07/28/16 10:02 PM  Result Value Ref Range   Amylase 70 28 - 100 U/L    Imaging:  [place] Mfm Ob Detail +14 Wk  Result Date: 07/08/2016 ----------------------------------------------------------------------  OBSTETRICS REPORT                      (Signed Final 07/08/2016 01:29 pm) ---------------------------------------------------------------------- Patient Info  ID #:       [id number]                         D.O.B.:   02-16-1996 (20 yrs)  Name:       Katherine Preston              Visit Date:  06/30/2016 10:50 am  ---------------------------------------------------------------------- Performed By  Performed By:     Percell Boston          Ref. Address:     [address]                                                             [place], [place]                                                             [address]  Attending:        Particia Nearing MD       Location:         [facility]  Referred By:      Catalina Antigua MD ---------------------------------------------------------------------- Orders   #  Description                                 Code   1  [place] MFM OB DETAIL +14 WK                     76811.01  ----------------------------------------------------------------------   #  Ordered By               Order #        Accession #    Episode #   1  Particia Nearing            [id number]      [id number]     [id number]  ---------------------------------------------------------------------- Indications   [redacted] weeks gestation of pregnancy                Z3A.18   Encounter for antenatal screening for          Z36.3   malformations   Maternal morbid obesity                        O99.210 E66.01  ---------------------------------------------------------------------- OB History  Blood Type:            Height:  5'2"   Weight (lb):  257      BMI:   47  Gravidity:    1 ---------------------------------------------------------------------- Fetal Evaluation  Num Of Fetuses:     1  Fetal Heart         136  Rate(bpm):  Cardiac Activity:   Observed  Presentation:       Cephalic  Placenta:  Posterior, above cervical os  P. Cord Insertion:  Visualized, central  Amniotic Fluid  AFI FV:      Subjectively within normal limits ---------------------------------------------------------------------- Biometry  BPD:      42.3  mm     G. Age:  18w 6d         68  %    CI:        76.34   %   70 - 86                                                           FL/HC:      18.4   %   15.8 - 18  HC:      153.4  mm     G. Age:  18w 2d         38  %    HC/AC:      1.16       1.07 - 1.29  AC:      132.5  mm     G. Age:  18w 5d         57  %    FL/BPD:     66.9   %  FL:       28.3  mm     G. Age:  18w 5d         53  %    FL/AC:      21.4   %   20 - 24  HUM:      26.8  mm     G. Age:  18w 4d         56  %  Est. FW:     252  gm      0 lb 9 oz     51  % ---------------------------------------------------------------------- Gestational Age  LMP:           18w 3d       Date:   02/22/16                 EDD:   11/28/16  U/S Today:     18w 5d                                        EDD:   11/26/16  Best:          18w 3d    Det. By:   LMP  (02/22/16)          EDD:   11/28/16 ---------------------------------------------------------------------- Anatomy  Cranium:               Appears normal         Aortic Arch:            Appears normal  Cavum:                 Appears normal         Ductal Arch:            Not well visualized  Ventricles:            Appears normal         Diaphragm:  Appears normal  Choroid Plexus:        Appears normal         Stomach:                Appears normal, left                                                                        sided  Cerebellum:            Appears normal         Abdomen:                Appears normal  Posterior Fossa:       Appears normal         Abdominal Wall:         Appears nml (cord                                                                        insert, abd wall)  Nuchal Fold:           Not well visualized    Cord Vessels:           Appears normal (3                                                                        vessel cord)  Face:                  Appears normal         Kidneys:                Appear normal                         (orbits and profile)  Lips:                  Not well visualized    Bladder:                Appears normal  Thoracic:              Appears normal         Spine:                   Appears normal  Heart:                 Appears normal         Upper Extremities:      Appears normal                         (4CH, axis, and si  RVOT:  Not well visualized    Lower Extremities:      Appears normal  LVOT:                  Not well visualized  Other:  Heels visualized. Technically difficult due to maternal habitus and          fetal position. ---------------------------------------------------------------------- Cervix Uterus Adnexa  Cervix  Length:            3.1  cm.  Normal appearance by transabdominal scan.  Uterus  No abnormality visualized.  Left Ovary  No adnexal mass visualized.  Right Ovary  No adnexal mass visualized.  Cul De Sac:   No free fluid seen.  Adnexa:       No abnormality visualized. ---------------------------------------------------------------------- Impression  SIUP at 18+3 weeks  Normal detailed fetal anatomy; limited views of NF, upper  lips, outflow tracts and the DA  Markers of aneuploidy: none  Normal amniotic fluid volume  Measurements consistent with LMP dating ---------------------------------------------------------------------- Recommendations  Follow-up ultrasound in 4-6 weeks to complete anatomy  survey ----------------------------------------------------------------------                 Particia Nearing, MD Electronically Signed Final Report   07/08/2016 01:29 pm ----------------------------------------------------------------------  [place] Mfm Ob Follow Up  Result Date: 07/28/2016 ----------------------------------------------------------------------  OBSTETRICS REPORT                      (Signed Final 07/28/2016 07:10 pm) ---------------------------------------------------------------------- Patient Info  ID #:       [id number]                         D.O.B.:   [birth year] (20 yrs)  Name:       Katherine Preston              Visit Date:  07/28/2016 11:03 am ---------------------------------------------------------------------- Performed By   Performed By:     Vivien Rota        Ref. Address:     [address]                                                             [place], [place]                                                             [address]  Attending:        Particia Nearing MD       Location:         [facility]  Referred By:      Gigi Gin  CONSTANT MD ---------------------------------------------------------------------- Orders   #  Description                                 Code   1  [place] MFM OB FOLLOW UP                         [id number]  ----------------------------------------------------------------------   #  Ordered By               Order #        Accession #    Episode #   1  RACHELLE Marjo Bicker          [id number]      [id number]     [id number]  ---------------------------------------------------------------------- Indications   [redacted] weeks gestation of pregnancy                Z3A.22   Maternal morbid obesity                        O99.210 E66.01   Encounter for other antenatal screening        Z36.2   follow-up   Antenatal follow-up for nonvisualized fetal    Z36.2   anatomy  ---------------------------------------------------------------------- OB History  Blood Type:            Height:  5'2"   Weight (lb):  257      BMI:   47  Gravidity:    1 ---------------------------------------------------------------------- Fetal Evaluation  Num Of Fetuses:     1  Fetal Heart         153  Rate(bpm):  Cardiac Activity:   Observed  Presentation:       Breech  Placenta:           Posterior, above cervical os  P. Cord Insertion:  Previously Visualized  Amniotic Fluid  AFI FV:      Subjectively within normal limits                              Largest Pocket(cm)                              4.00 ---------------------------------------------------------------------- Biometry  BPD:      50.7  mm     G. Age:  21w 3d         12  %    CI:        66.22    %   70 - 86                                                          FL/HC:      18.4   %   18.4 - 20.2  HC:      199.8  mm     G. Age:  22w 1d         26  %    HC/AC:      1.14       1.06 - 1.25  AC:      175.2  mm     G.  Age:  22w 3d         43  %    FL/BPD:     72.6   %   71 - 87  FL:       36.8  mm     G. Age:  21w 5d         19  %    FL/AC:      21.0   %   20 - 24  HUM:      36.2  mm     G. Age:  22w 5d         49  %  Est. FW:     474  gm      1 lb 1 oz     40  % ---------------------------------------------------------------------- Gestational Age  LMP:           22w 3d       Date:   02/22/16                 EDD:   11/28/16  U/S Today:     22w 0d                                        EDD:   12/01/16  Best:          22w 3d    Det. By:   LMP  (02/22/16)          EDD:   11/28/16 ---------------------------------------------------------------------- Anatomy  Cranium:               Appears normal         Aortic Arch:            Previously seen  Cavum:                 Previously seen        Ductal Arch:            Appears normal  Ventricles:            Appears normal         Diaphragm:              Previously seen  Choroid Plexus:        Previously seen        Stomach:                Appears normal, left                                                                        sided  Cerebellum:            Previously seen        Abdomen:                Appears normal  Posterior Fossa:       Previously seen        Abdominal Wall:         ? cyst prev seen  Nuchal Fold:           Not applicable (>20    Cord Vessels:  Previously seen                         wks GA)  Face:                  Orbits and profile     Kidneys:                Appear normal                         previously seen  Lips:                  Appears normal         Bladder:                Appears normal  Thoracic:              Appears normal         Spine:                  Previously seen  Heart:                 Appears normal         Upper  Extremities:      Previously seen                         (4CH, axis, and                         situs)  RVOT:                  Appears normal         Lower Extremities:      Previously seen  LVOT:                  Appears normal  Other:  Heels prev visualized. Technically difficult due to maternal habitus          and fetal position. ---------------------------------------------------------------------- Cervix Uterus Adnexa  Cervix  Length:           4.56  cm.  Normal appearance by transabdominal scan.  Uterus  No abnormality visualized.  Left Ovary  Not visualized.  Right Ovary  Not visualized.  Adnexa:       No abnormality visualized. No adnexal mass                visualized. ---------------------------------------------------------------------- Impression  SIUP at 22+3 weeks  Normal interval anatomy; anatomic survey complete  Normal amniotic fluid volume  Appropriate interval growth with EFW at the 40th %tile ---------------------------------------------------------------------- Recommendations  Follow-up as clinically indicated ----------------------------------------------------------------------                 Particia NearingMartha Decker, MD Electronically Signed Final Report   07/28/2016 07:10 pm ----------------------------------------------------------------------   MAU Course: Orders Placed This Encounter  Procedures  . Urinalysis, Routine w reflex microscopic  . Comprehensive metabolic panel  . CBC with Differential/Platelet  . Lipase, blood  . Amylase  . Diet - low sodium heart healthy  . Increase activity slowly  . Discharge patient Discharge disposition: 01-Home or Self Care; Discharge patient date: 07/28/2016   Meds ordered this encounter  Medications  . acetaminophen (TYLENOL) 325 MG tablet    Sig: Take 650 mg by mouth every 6 (six) hours as needed for mild pain or headache.  .Marland Kitchen  traMADol (ULTRAM) tablet 50 mg  . ondansetron (ZOFRAN) tablet 8 mg  . gi cocktail (Maalox,Lidocaine,Donnatal)  .  DISCONTD: traMADol (ULTRAM) 50 MG tablet    Sig: Take 1 tablet (50 mg total) by mouth every 6 (six) hours as needed.    Dispense:  10 tablet    Refill:  0  . traMADol (ULTRAM) 50 MG tablet    Sig: Take 1 tablet (50 mg total) by mouth every 6 (six) hours as needed.    Dispense:  10 tablet    Refill:  0   Assessment & Plan: 1. RUQ pain   2. GBS bacteriuria   RUQ pain greatly imporved with ultram. Given zofran with resolution of vomiting. Urinalysis, CBC and CMET unremarkable with exception to mildly elevated AST of 64. Lipase and amylase negative. Pt has f/u OB appt 3/8 with [facility]. --Prescribed Ultram 50 mg q6h with #10 pills no refills --OB f/u 07/29/2016 at [facility] --Discharge home in stable condition --Labor precautions and fetal kick counts Follow-up Information    [facility]. Go on 07/28/2016.   Why:  Go to appt at 10:30 AM. Contact information: [address] [place] [address] [phone]          Allergies as of 07/29/2016   No Known Allergies     Medication List    TAKE these medications   acetaminophen 325 MG tablet Commonly known as:  TYLENOL Take 650 mg by mouth every 6 (six) hours as needed for mild pain or headache.   butalbital-acetaminophen-caffeine 50-325-40 MG tablet Commonly known as:  FIORICET, ESGIC Take 2 tablets by mouth every 6 (six) hours as needed for headache.   famotidine 20 MG tablet Commonly known as:  PEPCID Take 1 tablet (20 mg total) by mouth 2 (two) times daily.   PNV PO Take 1 tablet by mouth daily.   traMADol 50 MG tablet Commonly known as:  ULTRAM Take 1 tablet (50 mg total) by mouth every 6 (six) hours as needed.       Wendee Beavers, DO, PGY-1 07/29/2016, 1:12 AM   CNM attestation:  I have seen and examined this patient; I agree with above documentation in the resident's note.   Katherine Preston is a 20 y.o. G1P0000 reporting RUQ pain that radiates around to her back +FM,  denies LOF, VB, contractions, vaginal discharge.  PE: BP 106/71 (BP Location: Left Arm)   Pulse 102   Temp 97.8 F (36.6 C) (Oral)   Resp 22   LMP 02/22/2016   SpO2 100%  Gen: calm comfortable, NAD Resp: normal effort, no distress Abd: gravid  ROS, labs, PMH reviewed FHRs + by doppler; no ctx per toco  Plan: - States pain has gone w/ Ultram dose - Will follow AST of 64 - continue routine follow up in OB clinic on 3/8  Sriram Febles, CNM 3:32 AM

## 2016-07-28 NOTE — Discharge Instructions (Signed)

## 2016-07-28 NOTE — MAU Note (Signed)
Pt presents via EMS with complaint of right side pain that is just under her right breast and feels like it is in her ribs. States it started about an hour ago.

## 2016-07-29 ENCOUNTER — Other Ambulatory Visit: Payer: Self-pay | Admitting: Certified Nurse Midwife

## 2016-07-29 ENCOUNTER — Ambulatory Visit (INDEPENDENT_AMBULATORY_CARE_PROVIDER_SITE_OTHER): Payer: BLUE CROSS/BLUE SHIELD | Admitting: Certified Nurse Midwife

## 2016-07-29 VITALS — BP 125/77 | HR 108 | Wt 256.4 lb

## 2016-07-29 DIAGNOSIS — Z3400 Encounter for supervision of normal first pregnancy, unspecified trimester: Secondary | ICD-10-CM

## 2016-07-29 DIAGNOSIS — Z3402 Encounter for supervision of normal first pregnancy, second trimester: Secondary | ICD-10-CM

## 2016-07-29 DIAGNOSIS — R8271 Bacteriuria: Secondary | ICD-10-CM

## 2016-07-29 DIAGNOSIS — R1011 Right upper quadrant pain: Secondary | ICD-10-CM

## 2016-07-29 MED ORDER — TRAMADOL HCL 50 MG PO TABS: 50.0000 mg | ORAL_TABLET | Freq: Four times a day (QID) | ORAL | 0 refills | Status: DC | PRN

## 2016-07-29 NOTE — Progress Notes (Signed)
Patient was seen at MAU for pain- they did test for gall bladder. Patient would like her results.

## 2016-07-29 NOTE — Progress Notes (Signed)
   PRENATAL VISIT NOTE  Subjective:  Katherine Preston is a 21 y.o. G1P0000 at 531w4d being seen today for ongoing prenatal care.  She is currently monitored for the following issues for this low-risk pregnancy and has Obese; Supervision of normal pregnancy, antepartum; and GBS bacteriuria on her problem list.  Patient reports no complaints.  Contractions: Not present. Vag. Bleeding: None.  Movement: Present. Denies leaking of fluid.   The following portions of the patient's history were reviewed and updated as appropriate: allergies, current medications, past family history, past medical history, past social history, past surgical history and problem list. Problem list updated.  Objective:   Vitals:   07/29/16 1048  BP: 125/77  Pulse: (!) 108  Weight: 256 lb 6.4 oz (116.3 kg)    Fetal Status: Fetal Heart Rate (bpm): 147 Fundal Height: 23 cm Movement: Present     General:  Alert, oriented and cooperative. Patient is in no acute distress.  Skin: Skin is warm and dry. No rash noted.   Cardiovascular: Normal heart rate noted  Respiratory: Normal respiratory effort, no problems with respiration noted  Abdomen: Soft, gravid, appropriate for gestational age. Pain/Pressure: Absent     Pelvic:  Cervical exam deferred        Extremities: Normal range of motion.  Edema: None  Mental Status: Normal mood and affect. Normal behavior. Normal judgment and thought content.   Assessment and Plan:  Pregnancy: G1P0000 at 3031w4d  1. GBS bacteriuria     PCN for delivery  2. Supervision of normal first pregnancy, antepartum     Doing well.  MAU notes reviewed, no complaints today.   Preterm labor symptoms and general obstetric precautions including but not limited to vaginal bleeding, contractions, leaking of fluid and fetal movement were reviewed in detail with the patient. Please refer to After Visit Summary for other counseling recommendations.  Return in about 6 weeks (around 09/09/2016) for  babyscripts, ROB, 2 hr OGTT.   Roe Coombsachelle A Gabrian Hoque, CNM

## 2016-09-06 ENCOUNTER — Ambulatory Visit (INDEPENDENT_AMBULATORY_CARE_PROVIDER_SITE_OTHER): Payer: BLUE CROSS/BLUE SHIELD | Admitting: Certified Nurse Midwife

## 2016-09-06 ENCOUNTER — Other Ambulatory Visit (HOSPITAL_COMMUNITY)
Admission: RE | Admit: 2016-09-06 | Discharge: 2016-09-06 | Disposition: A | Discharge status: Home / Self Care | Payer: BLUE CROSS/BLUE SHIELD | Source: Ambulatory Visit | Attending: Certified Nurse Midwife | Admitting: Certified Nurse Midwife

## 2016-09-06 ENCOUNTER — Other Ambulatory Visit: Payer: BLUE CROSS/BLUE SHIELD

## 2016-09-06 ENCOUNTER — Encounter: Payer: Self-pay | Admitting: Certified Nurse Midwife

## 2016-09-06 VITALS — BP 108/72 | HR 103 | Wt 258.0 lb

## 2016-09-06 DIAGNOSIS — Z3400 Encounter for supervision of normal first pregnancy, unspecified trimester: Secondary | ICD-10-CM

## 2016-09-06 DIAGNOSIS — Z113 Encounter for screening for infections with a predominantly sexual mode of transmission: Secondary | ICD-10-CM | POA: Diagnosis not present

## 2016-09-06 DIAGNOSIS — Z3403 Encounter for supervision of normal first pregnancy, third trimester: Secondary | ICD-10-CM | POA: Diagnosis not present

## 2016-09-06 DIAGNOSIS — O99213 Obesity complicating pregnancy, third trimester: Secondary | ICD-10-CM

## 2016-09-06 DIAGNOSIS — Z3402 Encounter for supervision of normal first pregnancy, second trimester: Secondary | ICD-10-CM

## 2016-09-06 DIAGNOSIS — N949 Unspecified condition associated with female genital organs and menstrual cycle: Secondary | ICD-10-CM

## 2016-09-06 DIAGNOSIS — Z6841 Body Mass Index (BMI) 40.0 and over, adult: Secondary | ICD-10-CM

## 2016-09-06 DIAGNOSIS — R8271 Bacteriuria: Secondary | ICD-10-CM

## 2016-09-06 LAB — FETAL FIBRONECTIN: FETAL FIBRONECTIN: POSITIVE — AB

## 2016-09-06 MED ORDER — CYCLOBENZAPRINE HCL 10 MG PO TABS: 10.0000 mg | ORAL_TABLET | Freq: Three times a day (TID) | ORAL | 1 refills | Status: DC | PRN

## 2016-09-06 MED ORDER — COMFORT FIT MATERNITY SUPP LG MISC: 1.0000 [IU] | Freq: Every day | 0 refills | Status: DC

## 2016-09-06 NOTE — Progress Notes (Signed)
   PRENATAL VISIT NOTE  Subjective:  Katherine Preston is a 21 y.o. G1P0000 at [redacted]w[redacted]d being seen today for ongoing prenatal care.  She is currently monitored for the following issues for this low-risk pregnancy and has Obese; Supervision of normal pregnancy, antepartum; and GBS bacteriuria on her problem list.  Patient reports bleeding, no contractions, no cramping and this am woke up with brown/red area of discharge, denies any contractions, thinks she is having round ligament pain as well.  Contractions: Not present. Vag. Bleeding: Scant.  Movement: Present. Denies leaking of fluid.   The following portions of the patient's history were reviewed and updated as appropriate: allergies, current medications, past family history, past medical history, past social history, past surgical history and problem list. Problem list updated.  Objective:   Vitals:   09/06/16 0917  BP: 108/72  Pulse: (!) 103  Weight: 258 lb (117 kg)    Fetal Status: Fetal Heart Rate (bpm): 142 Fundal Height: 30 cm Movement: Present  Presentation: Vertex  General:  Alert, oriented and cooperative. Patient is in no acute distress.  Skin: Skin is warm and dry. No rash noted.   Cardiovascular: Normal heart rate noted  Respiratory: Normal respiratory effort, no problems with respiration noted  Abdomen: Soft, gravid, appropriate for gestational age. Pain/Pressure: Present     Pelvic:  Cervical exam performed Dilation: Closed Effacement (%): 50 Station: Ballotable  Extremities: Normal range of motion.  Edema: None  Mental Status: Normal mood and affect. Normal behavior. Normal judgment and thought content.   Assessment and Plan:  Pregnancy: G1P0000 at [redacted]w[redacted]d  1. Supervision of normal first pregnancy, antepartum      FFN obtained for spotting this morning.  Discussed POC with Dr. Ladean Raya, recheck cervix later this week if changed give BMZ. - Glucose Tolerance, 2 Hours w/1 Hour - CBC - HIV antibody (with reflex) -  RPR - cyclobenzaprine (FLEXERIL) 10 MG tablet; Take 1 tablet (10 mg total) by mouth every 8 (eight) hours as needed for muscle spasms.  Dispense: 30 tablet; Refill: 1 - Fetal fibronectin - Cervicovaginal ancillary only  2. GBS bacteriuria      PCN for labor/delivery  3. Class 3 severe obesity due to excess calories without serious comorbidity with body mass index (BMI) of 45.0 to 49.9 in adult [facility])      Lost 7lbs this pregnancy, denies any dieting  4. Round ligament pain    Homeopathic recommendations given as well.    - Elastic Bandages & Supports (COMFORT FIT MATERNITY SUPP LG) MISC; 1 Units by Does not apply route daily.  Dispense: 1 each; Refill: 0 - cyclobenzaprine (FLEXERIL) 10 MG tablet; Take 1 tablet (10 mg total) by mouth every 8 (eight) hours as needed for muscle spasms.  Dispense: 30 tablet; Refill: 1  Preterm labor symptoms and general obstetric precautions including but not limited to vaginal bleeding, contractions, leaking of fluid and fetal movement were reviewed in detail with the patient. Please refer to After Visit Summary for other counseling recommendations.  Return in about 2 weeks (around 09/20/2016) for ROB; needs cervical exam later this week as well.   Roe Coombs, CNM

## 2016-09-06 NOTE — Progress Notes (Signed)
Patient states that she saw red/brown scant blood on her bed when she woke up this morning, denies contractions and reports pressure, and good fetal movement

## 2016-09-07 ENCOUNTER — Telehealth: Payer: Self-pay

## 2016-09-07 ENCOUNTER — Other Ambulatory Visit: Payer: Self-pay | Admitting: Certified Nurse Midwife

## 2016-09-07 DIAGNOSIS — O09899 Supervision of other high risk pregnancies, unspecified trimester: Secondary | ICD-10-CM | POA: Insufficient documentation

## 2016-09-07 DIAGNOSIS — O0990 Supervision of high risk pregnancy, unspecified, unspecified trimester: Secondary | ICD-10-CM

## 2016-09-07 DIAGNOSIS — R8789 Other abnormal findings in specimens from female genital organs: Secondary | ICD-10-CM | POA: Insufficient documentation

## 2016-09-07 DIAGNOSIS — O24419 Gestational diabetes mellitus in pregnancy, unspecified control: Secondary | ICD-10-CM | POA: Insufficient documentation

## 2016-09-07 LAB — CBC
HEMATOCRIT: 34.6 % (ref 34.0–46.6)
HEMOGLOBIN: 11.4 g/dL (ref 11.1–15.9)
MCH: 24.9 pg — AB (ref 26.6–33.0)
MCHC: 32.9 g/dL (ref 31.5–35.7)
MCV: 76 fL — ABNORMAL LOW (ref 79–97)
Platelets: 273 10*3/uL (ref 150–379)
RBC: 4.57 x10E6/uL (ref 3.77–5.28)
RDW: 15.7 % — ABNORMAL HIGH (ref 12.3–15.4)
WBC: 11.3 10*3/uL — ABNORMAL HIGH (ref 3.4–10.8)

## 2016-09-07 LAB — GLUCOSE TOLERANCE, 2 HOURS W/ 1HR
Glucose, 1 hour: 146 mg/dL (ref 65–179)
Glucose, 2 hour: 155 mg/dL — ABNORMAL HIGH (ref 65–152)
Glucose, Fasting: 79 mg/dL (ref 65–91)

## 2016-09-07 LAB — CERVICOVAGINAL ANCILLARY ONLY
Chlamydia: NEGATIVE
NEISSERIA GONORRHEA: NEGATIVE
TRICH (WINDOWPATH): NEGATIVE

## 2016-09-07 LAB — RPR: RPR: NONREACTIVE

## 2016-09-07 LAB — HIV ANTIBODY (ROUTINE TESTING W REFLEX): HIV SCREEN 4TH GENERATION: NONREACTIVE

## 2016-09-07 MED ORDER — ACCU-CHEK GUIDE W/DEVICE KIT: 1.0000 | PACK | Freq: Four times a day (QID) | 0 refills | Status: DC

## 2016-09-07 MED ORDER — GLUCOSE BLOOD VI STRP: ORAL_STRIP | 12 refills | Status: DC

## 2016-09-07 MED ORDER — ACCU-CHEK FASTCLIX LANCETS MISC
1.0000 | Freq: Four times a day (QID) | 12 refills | Status: DC
Start: 2016-09-07 — End: 2016-09-07

## 2016-09-07 MED ORDER — BAYER CONTOUR MONITOR W/DEVICE KIT: 1.0000 | PACK | Freq: Four times a day (QID) | 0 refills | Status: DC

## 2016-09-07 NOTE — Telephone Encounter (Signed)
Pt requesting test results from yesterday. Pt aware FFN is positive and she needs a cx check this week. Appts will contact pt to schedule the appt. Pt is to start pelvic rest - nothing in the vagina ie. IC, no nipple stimulation, nothing that would cause an organism. During call, informed pt 2 gtt is elevated and she has GDM. Meter, lancets, and strips sent to [facility]. Pt is to start check checking CBG's 4x's daily and bring readings to office (explained in detail). N&D referral sent today and they will contact her with an appt. Pt agrees and has no further questions.

## 2016-09-07 NOTE — Addendum Note (Signed)
Addended by: Dalphine Handing on: 09/07/2016 01:39 PM   Modules accepted: Orders

## 2016-09-09 ENCOUNTER — Encounter: Payer: Self-pay | Admitting: Certified Nurse Midwife

## 2016-09-09 ENCOUNTER — Encounter (HOSPITAL_COMMUNITY): Payer: Self-pay | Admitting: *Deleted

## 2016-09-09 ENCOUNTER — Ambulatory Visit (INDEPENDENT_AMBULATORY_CARE_PROVIDER_SITE_OTHER): Payer: BLUE CROSS/BLUE SHIELD | Admitting: Certified Nurse Midwife

## 2016-09-09 ENCOUNTER — Inpatient Hospital Stay (HOSPITAL_COMMUNITY)
Admission: AD | Admit: 2016-09-09 | Discharge: 2016-09-09 | Disposition: A | Discharge status: Home / Self Care | Payer: BLUE CROSS/BLUE SHIELD | Source: Ambulatory Visit | Attending: Obstetrics and Gynecology | Admitting: Obstetrics and Gynecology

## 2016-09-09 VITALS — BP 100/68 | HR 93 | Wt 258.0 lb

## 2016-09-09 DIAGNOSIS — O99343 Other mental disorders complicating pregnancy, third trimester: Secondary | ICD-10-CM | POA: Diagnosis not present

## 2016-09-09 DIAGNOSIS — R102 Pelvic and perineal pain: Secondary | ICD-10-CM | POA: Insufficient documentation

## 2016-09-09 DIAGNOSIS — Z79899 Other long term (current) drug therapy: Secondary | ICD-10-CM | POA: Insufficient documentation

## 2016-09-09 DIAGNOSIS — O99353 Diseases of the nervous system complicating pregnancy, third trimester: Secondary | ICD-10-CM | POA: Diagnosis not present

## 2016-09-09 DIAGNOSIS — O3430 Maternal care for cervical incompetence, unspecified trimester: Secondary | ICD-10-CM | POA: Insufficient documentation

## 2016-09-09 DIAGNOSIS — O0990 Supervision of high risk pregnancy, unspecified, unspecified trimester: Secondary | ICD-10-CM

## 2016-09-09 DIAGNOSIS — R109 Unspecified abdominal pain: Secondary | ICD-10-CM

## 2016-09-09 DIAGNOSIS — Z3A28 28 weeks gestation of pregnancy: Secondary | ICD-10-CM | POA: Diagnosis not present

## 2016-09-09 DIAGNOSIS — G4730 Sleep apnea, unspecified: Secondary | ICD-10-CM | POA: Diagnosis not present

## 2016-09-09 DIAGNOSIS — O26893 Other specified pregnancy related conditions, third trimester: Secondary | ICD-10-CM | POA: Insufficient documentation

## 2016-09-09 DIAGNOSIS — N949 Unspecified condition associated with female genital organs and menstrual cycle: Secondary | ICD-10-CM

## 2016-09-09 DIAGNOSIS — R8789 Other abnormal findings in specimens from female genital organs: Secondary | ICD-10-CM

## 2016-09-09 DIAGNOSIS — O9989 Other specified diseases and conditions complicating pregnancy, childbirth and the puerperium: Secondary | ICD-10-CM

## 2016-09-09 DIAGNOSIS — O0993 Supervision of high risk pregnancy, unspecified, third trimester: Secondary | ICD-10-CM

## 2016-09-09 DIAGNOSIS — O479 False labor, unspecified: Secondary | ICD-10-CM | POA: Diagnosis not present

## 2016-09-09 DIAGNOSIS — R8271 Bacteriuria: Secondary | ICD-10-CM

## 2016-09-09 DIAGNOSIS — O09899 Supervision of other high risk pregnancies, unspecified trimester: Secondary | ICD-10-CM

## 2016-09-09 DIAGNOSIS — K5900 Constipation, unspecified: Secondary | ICD-10-CM

## 2016-09-09 DIAGNOSIS — F419 Anxiety disorder, unspecified: Secondary | ICD-10-CM | POA: Diagnosis not present

## 2016-09-09 DIAGNOSIS — O3433 Maternal care for cervical incompetence, third trimester: Secondary | ICD-10-CM

## 2016-09-09 LAB — URINALYSIS, ROUTINE W REFLEX MICROSCOPIC
Bilirubin Urine: NEGATIVE
Glucose, UA: NEGATIVE mg/dL
Hgb urine dipstick: NEGATIVE
Ketones, ur: 20 mg/dL — AB
Leukocytes, UA: NEGATIVE
Nitrite: NEGATIVE
Protein, ur: NEGATIVE mg/dL
Specific Gravity, Urine: 1.01 (ref 1.005–1.030)
pH: 7 (ref 5.0–8.0)

## 2016-09-09 MED ORDER — BETAMETHASONE SOD PHOS & ACET 6 (3-3) MG/ML IJ SUSP: 12.0000 mg | Freq: Once | INTRAMUSCULAR | Status: DC

## 2016-09-09 MED ORDER — BETAMETHASONE SOD PHOS & ACET 6 (3-3) MG/ML IJ SUSP
12.0000 mg | Freq: Once | INTRAMUSCULAR | Status: AC
  Administered 2016-09-09: 12 mg via INTRAMUSCULAR
  Filled 2016-09-09: qty 2

## 2016-09-09 NOTE — MAU Provider Note (Signed)
History     CSN: [id number]  Arrival date and time: 09/09/16 1609   First Provider Initiated Contact with Patient 09/09/16 1738      Chief Complaint  Patient presents with  . Non-stress Test   HPI Katherine Preston is a 21 y.o. G1P0000 at 39w4dwho presents to MAU today from the office for contractions monitoring and betamethasone injection. The patient had +FFN last week and cervix changed from closed to fingertip, per office report. She denies vaginal bleeding, LOF or contractions. She has lower abdominal pressure that comes and goes and is most often present with movement, standing and change of positions. She reports good fetal movement. She was recently diagnosed with GDM.    OB History    Gravida Para Term Preterm AB Living   1 0 0 0 0 0   SAB TAB Ectopic Multiple Live Births   0 0 0 0        Past Medical History:  Diagnosis Date  . Allergic rhinitis   . Anxiety    per patient, no diagnosis  . Sleep apnea     Past Surgical History:  Procedure Laterality Date  . WISDOM TOOTH EXTRACTION      Family History  Problem Relation Age of Onset  . Fibroids Mother     Social History  Substance Use Topics  . Smoking status: Never Smoker  . Smokeless tobacco: Never Used  . Alcohol use 0.0 oz/week     Comment: Occassional -not with pregnancy    Allergies: No Known Allergies  Prescriptions Prior to Admission  Medication Sig Dispense Refill Last Dose  . acetaminophen (TYLENOL) 325 MG tablet Take 650 mg by mouth every 6 (six) hours as needed for mild pain or headache.   Not Taking  . Blood Glucose Monitoring Suppl (BAYER CONTOUR MONITOR) w/Device KIT 1 Device by Does not apply route 4 (four) times daily. Check capillary blood sugars 4 times daily 1 kit 0   . butalbital-acetaminophen-caffeine (FIORICET, ESGIC) 50-325-40 MG tablet Take 2 tablets by mouth every 6 (six) hours as needed for headache. 45 tablet 5 Taking  . cyclobenzaprine (FLEXERIL) 10 MG tablet Take 1  tablet (10 mg total) by mouth every 8 (eight) hours as needed for muscle spasms. 30 tablet 1   . Elastic Bandages & Supports (COMFORT FIT MATERNITY SUPP LG) MISC 1 Units by Does not apply route daily. 1 each 0   . famotidine (PEPCID) 20 MG tablet Take 1 tablet (20 mg total) by mouth 2 (two) times daily. (Patient not taking: Reported on 07/28/2016) 30 tablet 1 Not Taking  . glucose blood (BAYER CONTOUR TEST) test strip Check capillary blood sugars 4 times daily 100 each 12   . Prenatal Vit w/Fe-Methylfol-FA (PNV PO) Take 1 tablet by mouth daily.    Taking  . [DISCONTINUED] traMADol (ULTRAM) 50 MG tablet Take 1 tablet (50 mg total) by mouth every 6 (six) hours as needed. 10 tablet 0 Taking    Review of Systems  Gastrointestinal: Negative for abdominal pain.  Genitourinary: Negative for dysuria, frequency, urgency, vaginal bleeding and vaginal discharge.   Physical Exam   Blood pressure (!) 112/58, pulse 84, temperature 98.8 F (37.1 C), temperature source Oral, resp. rate 18, last menstrual period 02/22/2016.  Physical Exam  Nursing note and vitals reviewed. Constitutional: She is oriented to person, place, and time. She appears well-developed and well-nourished. No distress.  HENT:  Head: Normocephalic and atraumatic.  Cardiovascular: Normal rate.   Respiratory: Effort  normal.  GI: Soft. She exhibits no distension and no mass. There is no tenderness. There is no rebound and no guarding.  Neurological: She is alert and oriented to person, place, and time.  Skin: Skin is warm and dry. No erythema.  Psychiatric: She has a normal mood and affect.    Results for orders placed or performed during the hospital encounter of 09/09/16 (from the past 24 hour(s))  Urinalysis, Routine w reflex microscopic     Status: Abnormal   Collection Time: 09/09/16  5:03 PM  Result Value Ref Range   Color, Urine YELLOW YELLOW   APPearance CLEAR CLEAR   Specific Gravity, Urine 1.010 1.005 - 1.030   pH 7.0  5.0 - 8.0   Glucose, UA NEGATIVE NEGATIVE mg/dL   Hgb urine dipstick NEGATIVE NEGATIVE   Bilirubin Urine NEGATIVE NEGATIVE   Ketones, ur 20 (A) NEGATIVE mg/dL   Protein, ur NEGATIVE NEGATIVE mg/dL   Nitrite NEGATIVE NEGATIVE   Leukocytes, UA NEGATIVE NEGATIVE   Fetal Monitoring: Baseline: 130 bpm Variability: moderate Accelerations: 10 x 10 Decelerations: none Contractions: none   MAU Course  Procedures None  MDM BMZ given. Will return to MAU for second injection tomorrow since it will need to be given in the evening and the [facility] is closed at noon.  No contractions noted today Reviewed [place] from early March. Cervical length > 4.5 cm Assessment and Plan  A: SIUP at 63w4dRound ligament pain  Preterm labor   P: Discharge home Preterm labor precautions discussed Patient advised to follow-up in MAU tomorrow evening for second BMZ injection [place] [facility] Patient may return to MAU as needed or if her condition were to change or worsen    JLuvenia Redden PA-C  09/09/2016, 5:38 PM

## 2016-09-09 NOTE — Progress Notes (Signed)
   PRENATAL VISIT NOTE  Subjective:  Katherine Preston is a 21 y.o. G1P0000 at [redacted]w[redacted]d being seen today for ongoing prenatal care.  She is currently monitored for the following issues for this high-risk pregnancy and has Obese; Supervision of high risk pregnancy, antepartum; GBS bacteriuria; Gestational diabetes mellitus (GDM) in third trimester; Positive fetal fibronectin at 22 weeks to [redacted] weeks gestation; and Premature dilation of cervix during pregnancy, antepartum on her problem list.  Patient reports backache, fatigue, no bleeding, no leaking and occasional contractions.  Contractions: Not present. Vag. Bleeding: None.  Movement: Present. Denies leaking of fluid.   The following portions of the patient's history were reviewed and updated as appropriate: allergies, current medications, past family history, past medical history, past social history, past surgical history and problem list. Problem list updated.  Objective:   Vitals:   09/09/16 1432  BP: 100/68  Pulse: 93  Weight: 258 lb (117 kg)    Fetal Status: Fetal Heart Rate (bpm): 141 Fundal Height: 32 cm Movement: Present     General:  Alert, oriented and cooperative. Patient is in no acute distress.  Skin: Skin is warm and dry. No rash noted.   Cardiovascular: Normal heart rate noted  Respiratory: Normal respiratory effort, no problems with respiration noted  Abdomen: Soft, gravid, appropriate for gestational age. Pain/Pressure: Present     Pelvic:  Cervical exam performed Dilation: Fingertip Effacement (%): 50 Station: Ballotable  Extremities: Normal range of motion.  Edema: None  Mental Status: Normal mood and affect. Normal behavior. Normal judgment and thought content.   Assessment and Plan:  Pregnancy: G1P0000 at [redacted]w[redacted]d  1. Premature dilation of cervix during pregnancy, antepartum     Cervical change of 0.5 cm from Monday, had discussed POC with Dr. Ladean Raya.  To MAU/probable admission for cervical surveillance given  +FFN. Needs BMZ steroids.  Discussed with patient and her mother, verbalized understanding.    2. Constipation, unspecified constipation type     Miralax and OTC colace.   3. Supervision of high risk pregnancy, antepartum      GDM, new diagnosis.  Has DM teaching scheduled.  Testing supplies ordered previously.   4. GBS bacteriuria     PCN for labor/delivery  5. Positive fetal fibronectin at 22 weeks to [redacted] weeks gestation     To [facility] for cervical change with +FFN.   Preterm labor symptoms and general obstetric precautions including but not limited to vaginal bleeding, contractions, leaking of fluid and fetal movement were reviewed in detail with the patient. Please refer to After Visit Summary for other counseling recommendations.  Return in about 2 weeks (around 09/23/2016) for [facility].   Roe Coombs, CNM

## 2016-09-09 NOTE — MAU Note (Signed)
Pt states she has pelvic pain for the past week, doesn't hurt when sitting still, hurts with movement, changing positions.

## 2016-09-09 NOTE — MAU Note (Signed)
Pt sent over for cervical change and steroid shots. Pt denies any pain or cramping at this time.

## 2016-09-09 NOTE — Discharge Instructions (Signed)
Fetal Movement Counts Patient Name: ________________________________________________ Patient Due Date: ____________________ What is a fetal movement count? A fetal movement count is the number of times that you feel your baby move during a certain amount of time. This may also be called a fetal kick count. A fetal movement count is recommended for every pregnant woman. You may be asked to start counting fetal movements as early as week 28 of your pregnancy. Pay attention to when your baby is most active. You may notice your baby's sleep and wake cycles. You may also notice things that make your baby move more. You should do a fetal movement count:  When your baby is normally most active.  At the same time each day. A good time to count movements is while you are resting, after having something to eat and drink. How do I count fetal movements? 1. Find a quiet, comfortable area. Sit, or lie down on your side. 2. Write down the date, the start time and stop time, and the number of movements that you felt between those two times. Take this information with you to your health care visits. 3. For 2 hours, count kicks, flutters, swishes, rolls, and jabs. You should feel at least 10 movements during 2 hours. 4. You may stop counting after you have felt 10 movements. 5. If you do not feel 10 movements in 2 hours, have something to eat and drink. Then, keep resting and counting for 1 hour. If you feel at least 4 movements during that hour, you may stop counting. Contact a health care provider if:  You feel fewer than 4 movements in 2 hours.  Your baby is not moving like he or she usually does. Date: ____________ Start time: ____________ Stop time: ____________ Movements: ____________ Date: ____________ Start time: ____________ Stop time: ____________ Movements: ____________ Date: ____________ Start time: ____________ Stop time: ____________ Movements: ____________ Date: ____________ Start time:  ____________ Stop time: ____________ Movements: ____________ Date: ____________ Start time: ____________ Stop time: ____________ Movements: ____________ Date: ____________ Start time: ____________ Stop time: ____________ Movements: ____________ Date: ____________ Start time: ____________ Stop time: ____________ Movements: ____________ Date: ____________ Start time: ____________ Stop time: ____________ Movements: ____________ Date: ____________ Start time: ____________ Stop time: ____________ Movements: ____________ This information is not intended to replace advice given to you by your health care provider. Make sure you discuss any questions you have with your health care provider. Document Released: 06/09/2006 Document Revised: 01/07/2016 Document Reviewed: 06/19/2015 Elsevier Interactive Patient Education  2017 [employer] Braxton Hicks Contractions Contractions of the uterus can occur throughout pregnancy, but they are not always a sign that you are in labor. You may have practice contractions called Braxton Hicks contractions. These false labor contractions are sometimes confused with true labor. What are Braxton Hicks contractions? Braxton Hicks contractions are tightening movements that occur in the muscles of the uterus before labor. Unlike true labor contractions, these contractions do not result in opening (dilation) and thinning of the cervix. Toward the end of pregnancy (32-34 weeks), Braxton Hicks contractions can happen more often and may become stronger. These contractions are sometimes difficult to tell apart from true labor because they can be very uncomfortable. You should not feel embarrassed if you go to the hospital with false labor. Sometimes, the only way to tell if you are in true labor is for your health care provider to look for changes in the cervix. The health care provider will do a physical exam and may monitor your contractions. If you   are not in true labor, the exam  should show that your cervix is not dilating and your water has not broken. If there are no prenatal problems or other health problems associated with your pregnancy, it is completely safe for you to be sent home with false labor. You may continue to have Braxton Hicks contractions until you go into true labor. How can I tell the difference between true labor and false labor?  Differences  False labor  Contractions last 30-70 seconds.: Contractions are usually shorter and not as strong as true labor contractions.  Contractions become very regular.: Contractions are usually irregular.  Discomfort is usually felt in the top of the uterus, and it spreads to the lower abdomen and low back.: Contractions are often felt in the front of the lower abdomen and in the groin.  Contractions do not go away with walking.: Contractions may go away when you walk around or change positions while lying down.  Contractions usually become more intense and increase in frequency.: Contractions get weaker and are shorter-lasting as time goes on.  The cervix dilates and gets thinner.: The cervix usually does not dilate or become thin. Follow these instructions at home:  Take over-the-counter and prescription medicines only as told by your health care provider.  Keep up with your usual exercises and follow other instructions from your health care provider.  Eat and drink lightly if you think you are going into labor.  If Braxton Hicks contractions are making you uncomfortable:  Change your position from lying down or resting to walking, or change from walking to resting.  Sit and rest in a tub of warm water.  Drink enough fluid to keep your urine clear or pale yellow. Dehydration may cause these contractions.  Do slow and deep breathing several times an hour.  Keep all follow-up prenatal visits as told by your health care provider. This is important. Contact a health care provider if:  You have a  fever.  You have continuous pain in your abdomen. Get help right away if:  Your contractions become stronger, more regular, and closer together.  You have fluid leaking or gushing from your vagina.  You pass blood-tinged mucus (bloody show).  You have bleeding from your vagina.  You have low back pain that you never had before.  You feel your baby's head pushing down and causing pelvic pressure.  Your baby is not moving inside you as much as it used to. Summary  Contractions that occur before labor are called Braxton Hicks contractions, false labor, or practice contractions.  Braxton Hicks contractions are usually shorter, weaker, farther apart, and less regular than true labor contractions. True labor contractions usually become progressively stronger and regular and they become more frequent.  Manage discomfort from Braxton Hicks contractions by changing position, resting in a warm bath, drinking plenty of water, or practicing deep breathing. This information is not intended to replace advice given to you by your health care provider. Make sure you discuss any questions you have with your health care provider. Document Released: 05/10/2005 Document Revised: 03/29/2016 Document Reviewed: 03/29/2016 Elsevier Interactive Patient Education  2017 [employer]  

## 2016-09-09 NOTE — Progress Notes (Signed)
Patient presents for ROB and Cervix check

## 2016-09-10 ENCOUNTER — Encounter (HOSPITAL_COMMUNITY): Payer: Self-pay | Admitting: *Deleted

## 2016-09-10 ENCOUNTER — Encounter: Payer: Self-pay | Admitting: *Deleted

## 2016-09-10 ENCOUNTER — Other Ambulatory Visit: Payer: Self-pay

## 2016-09-10 ENCOUNTER — Inpatient Hospital Stay (HOSPITAL_COMMUNITY)
Admission: AD | Admit: 2016-09-10 | Discharge: 2016-09-10 | Disposition: A | Discharge status: Home / Self Care | Payer: BLUE CROSS/BLUE SHIELD | Source: Ambulatory Visit | Attending: Family Medicine | Admitting: Family Medicine

## 2016-09-10 DIAGNOSIS — Z79899 Other long term (current) drug therapy: Secondary | ICD-10-CM | POA: Insufficient documentation

## 2016-09-10 DIAGNOSIS — O4703 False labor before 37 completed weeks of gestation, third trimester: Secondary | ICD-10-CM | POA: Insufficient documentation

## 2016-09-10 DIAGNOSIS — O479 False labor, unspecified: Secondary | ICD-10-CM | POA: Diagnosis not present

## 2016-09-10 DIAGNOSIS — Z3A28 28 weeks gestation of pregnancy: Secondary | ICD-10-CM | POA: Insufficient documentation

## 2016-09-10 DIAGNOSIS — R8789 Other abnormal findings in specimens from female genital organs: Secondary | ICD-10-CM

## 2016-09-10 DIAGNOSIS — O09899 Supervision of other high risk pregnancies, unspecified trimester: Secondary | ICD-10-CM

## 2016-09-10 MED ORDER — BAYER CONTOUR NEXT MONITOR W/DEVICE KIT: 1.0000 | PACK | Freq: Four times a day (QID) | 0 refills | Status: DC

## 2016-09-10 MED ORDER — GLUCOSE BLOOD VI STRP: ORAL_STRIP | 11 refills | Status: DC

## 2016-09-10 MED ORDER — BETAMETHASONE SOD PHOS & ACET 6 (3-3) MG/ML IJ SUSP
12.0000 mg | Freq: Once | INTRAMUSCULAR | Status: AC
  Administered 2016-09-10: 12 mg via INTRAMUSCULAR
  Filled 2016-09-10: qty 2

## 2016-09-10 NOTE — MAU Provider Note (Signed)
Chief Complaint:  BMZ injection and Abdominal Pain   None     HPI: Le Ferraz is a 21 y.o. G1P0000 at 3w5dwho presents to maternity admissions for second dose of BMZ, first dose given yesterday related to positive FFN on 4/16 and cervical change from closed to 0.5 cm this week.  She reports some irregular cramping/contractions this morning but denies any cramping now.   She reports good fetal movement, denies LOF, vaginal bleeding, vaginal itching/burning, urinary symptoms, h/a, dizziness, n/v, or fever/chills.    HPI  Past Medical History: Past Medical History:  Diagnosis Date  . Allergic rhinitis   . Anxiety    per patient, no diagnosis  . Sleep apnea     Past obstetric history: OB History  Gravida Para Term Preterm AB Living  1 0 0 0 0 0  SAB TAB Ectopic Multiple Live Births  0 0 0 0      # Outcome Date GA Lbr Len/2nd Weight Sex Delivery Anes PTL Lv  1 Current               Past Surgical History: Past Surgical History:  Procedure Laterality Date  . WISDOM TOOTH EXTRACTION      Family History: Family History  Problem Relation Age of Onset  . Fibroids Mother     Social History: Social History  Substance Use Topics  . Smoking status: Never Smoker  . Smokeless tobacco: Never Used  . Alcohol use 0.0 oz/week     Comment: Occassional -not with pregnancy    Allergies: No Known Allergies  Meds:  Prescriptions Prior to Admission  Medication Sig Dispense Refill Last Dose  . acetaminophen (TYLENOL) 325 MG tablet Take 650 mg by mouth every 6 (six) hours as needed for mild pain or headache.   Past Month at Unknown time  . butalbital-acetaminophen-caffeine (FIORICET, ESGIC) 50-325-40 MG tablet Take 2 tablets by mouth every 6 (six) hours as needed for headache. 45 tablet 5 Past Week at Unknown time  . cyclobenzaprine (FLEXERIL) 10 MG tablet Take 1 tablet (10 mg total) by mouth every 8 (eight) hours as needed for muscle spasms. 30 tablet 1 Past Week at Unknown  time  . Prenatal Vit w/Fe-Methylfol-FA (PNV PO) Take 1 tablet by mouth daily.    09/10/2016 at Unknown time  . Blood Glucose Monitoring Suppl (BAYER CONTOUR NEXT MONITOR) w/Device KIT 1 Device by Does not apply route 4 (four) times daily. Check capillary blood glucose 4 times daily 1 kit 0   . Elastic Bandages & Supports (COMFORT FIT MATERNITY SUPP LG) MISC 1 Units by Does not apply route daily. 1 each 0   . famotidine (PEPCID) 20 MG tablet Take 1 tablet (20 mg total) by mouth 2 (two) times daily. (Patient not taking: Reported on 09/10/2016) 30 tablet 1 Not Taking at Unknown time  . glucose blood (BAYER CONTOUR NEXT TEST) test strip Check capillary blood glucose 4 times daily 100 each 11     ROS:  Review of Systems  Constitutional: Negative for chills, fatigue and fever.  Eyes: Negative for visual disturbance.  Respiratory: Negative for shortness of breath.   Cardiovascular: Negative for chest pain.  Gastrointestinal: Negative for abdominal pain, nausea and vomiting.  Genitourinary: Negative for difficulty urinating, dysuria, flank pain, pelvic pain, vaginal bleeding, vaginal discharge and vaginal pain.  Neurological: Negative for dizziness and headaches.  Psychiatric/Behavioral: Negative.      I have reviewed patient's Past Medical Hx, Surgical Hx, Family Hx, Social Hx, medications  and allergies.   Physical Exam  Patient Vitals for the past 24 hrs:  BP Temp Temp src Pulse Resp SpO2 Height Weight  09/10/16 1716 120/70 98.2 F (36.8 C) Oral 100 18 99 % 5' 2.5" (1.588 m) 258 lb (117 kg)   Constitutional: Well-developed, well-nourished female in no acute distress.  Cardiovascular: normal rate Respiratory: normal effort GI: Abd soft, non-tender, gravid appropriate for gestational age.  MS: Extremities nontender, no edema, normal ROM Neurologic: Alert and oriented x 4.  GU: Neg CVAT.  PELVIC EXAM: Deferred    FHT:  Baseline 145 , moderate variability, accelerations present, no  decelerations Contractions: None on toco or to palpation   Labs: No results found for this or any previous visit (from the past 24 hour(s)). B/Positive/-- (12/18 1142)  Imaging:  No results found.  MAU Course/MDM: Pt placed in room by RN and CNM called to evaluated because of pt report of contractions today.  With no contractions during MAU visit, cervical exam deferred.   Second dose of BMZ given today.  Preterm labor precautions reviewed with pt.  Pt to keep scheduled prenatal visits in office. Return to MAU as needed for emergencies Pt stable at time of discharge.   Assessment: 1. Braxton Hicks contractions   2. Positive fetal fibronectin at 22 weeks to [redacted] weeks gestation     Plan: Discharge home  Labor precautions and fetal kick counts  Follow-up Information    [facility] Follow up.   Why:  As scheduled, return to MAU as needed for signs of labor or emergencies Contact information: [address] [place] [address] [phone]         Allergies as of 09/10/2016   No Known Allergies     Medication List    STOP taking these medications   famotidine 20 MG tablet Commonly known as:  PEPCID     TAKE these medications   acetaminophen 325 MG tablet Commonly known as:  TYLENOL Take 650 mg by mouth every 6 (six) hours as needed for mild pain or headache.   BAYER CONTOUR NEXT MONITOR w/Device Kit 1 Device by Does not apply route 4 (four) times daily. Check capillary blood glucose 4 times daily   butalbital-acetaminophen-caffeine 50-325-40 MG tablet Commonly known as:  FIORICET, ESGIC Take 2 tablets by mouth every 6 (six) hours as needed for headache.   COMFORT FIT MATERNITY SUPP LG Misc 1 Units by Does not apply route daily.   cyclobenzaprine 10 MG tablet Commonly known as:  FLEXERIL Take 1 tablet (10 mg total) by mouth every 8 (eight) hours as needed for muscle spasms.   glucose blood test strip Commonly known  as:  BAYER CONTOUR NEXT TEST Check capillary blood glucose 4 times daily   PNV PO Take 1 tablet by mouth daily.       Fatima Blank Certified Nurse-Midwife 09/10/2016 6:41 PM

## 2016-09-10 NOTE — MAU Note (Signed)
Patient presents to mau for 2nd BMZ injections. Report new onset lower abdominal cramping that started this am; rating pain 6/10. Denies LOF or vaginal bleeding at this time. +FM.

## 2016-09-10 NOTE — Progress Notes (Signed)
Kim from [employer] is obsolete. [employer] Next is available and covered by [employer].

## 2016-09-13 ENCOUNTER — Other Ambulatory Visit: Payer: Self-pay | Admitting: *Deleted

## 2016-09-13 DIAGNOSIS — O24410 Gestational diabetes mellitus in pregnancy, diet controlled: Secondary | ICD-10-CM

## 2016-09-13 MED ORDER — BAYER MICROLET LANCETS MISC: 99 refills | Status: DC

## 2016-09-13 NOTE — Progress Notes (Signed)
Contour microlet lancet

## 2016-09-15 ENCOUNTER — Encounter (HOSPITAL_COMMUNITY): Payer: Self-pay

## 2016-09-15 ENCOUNTER — Ambulatory Visit: Payer: BLUE CROSS/BLUE SHIELD

## 2016-09-15 ENCOUNTER — Ambulatory Visit (HOSPITAL_COMMUNITY): Admission: RE | Admit: 2016-09-15 | Payer: BLUE CROSS/BLUE SHIELD | Source: Ambulatory Visit

## 2016-09-15 ENCOUNTER — Other Ambulatory Visit: Payer: Self-pay | Admitting: Certified Nurse Midwife

## 2016-09-15 ENCOUNTER — Other Ambulatory Visit (HOSPITAL_COMMUNITY): Payer: Self-pay | Admitting: *Deleted

## 2016-09-15 ENCOUNTER — Ambulatory Visit (HOSPITAL_COMMUNITY): Payer: BLUE CROSS/BLUE SHIELD

## 2016-09-15 ENCOUNTER — Ambulatory Visit (HOSPITAL_COMMUNITY)
Admission: RE | Admit: 2016-09-15 | Discharge: 2016-09-15 | Disposition: A | Discharge status: Home / Self Care | Payer: BLUE CROSS/BLUE SHIELD | Source: Ambulatory Visit | Attending: Certified Nurse Midwife | Admitting: Certified Nurse Midwife

## 2016-09-15 DIAGNOSIS — O99210 Obesity complicating pregnancy, unspecified trimester: Secondary | ICD-10-CM

## 2016-09-15 DIAGNOSIS — O0990 Supervision of high risk pregnancy, unspecified, unspecified trimester: Secondary | ICD-10-CM

## 2016-09-15 DIAGNOSIS — O24419 Gestational diabetes mellitus in pregnancy, unspecified control: Secondary | ICD-10-CM

## 2016-09-15 DIAGNOSIS — R8789 Other abnormal findings in specimens from female genital organs: Secondary | ICD-10-CM

## 2016-09-15 DIAGNOSIS — Z3A29 29 weeks gestation of pregnancy: Secondary | ICD-10-CM | POA: Diagnosis not present

## 2016-09-15 DIAGNOSIS — O99213 Obesity complicating pregnancy, third trimester: Secondary | ICD-10-CM | POA: Insufficient documentation

## 2016-09-15 DIAGNOSIS — O09899 Supervision of other high risk pregnancies, unspecified trimester: Secondary | ICD-10-CM

## 2016-09-15 DIAGNOSIS — O26873 Cervical shortening, third trimester: Secondary | ICD-10-CM | POA: Insufficient documentation

## 2016-09-15 DIAGNOSIS — O24410 Gestational diabetes mellitus in pregnancy, diet controlled: Secondary | ICD-10-CM | POA: Diagnosis present

## 2016-09-15 NOTE — Addendum Note (Signed)
Encounter addended by: Marcellina Millin on: 09/15/2016 11:08 AM<BR>    Actions taken: Imaging Exam ended

## 2016-09-20 ENCOUNTER — Ambulatory Visit (INDEPENDENT_AMBULATORY_CARE_PROVIDER_SITE_OTHER): Payer: BLUE CROSS/BLUE SHIELD | Admitting: Obstetrics

## 2016-09-20 VITALS — BP 113/84 | HR 115 | Wt 258.6 lb

## 2016-09-20 DIAGNOSIS — O99810 Abnormal glucose complicating pregnancy: Secondary | ICD-10-CM

## 2016-09-20 DIAGNOSIS — O24419 Gestational diabetes mellitus in pregnancy, unspecified control: Secondary | ICD-10-CM

## 2016-09-20 NOTE — Progress Notes (Signed)
Patient presents for ROB visit.  

## 2016-09-21 ENCOUNTER — Encounter: Payer: BLUE CROSS/BLUE SHIELD | Attending: Certified Nurse Midwife | Admitting: Dietician

## 2016-09-21 ENCOUNTER — Encounter: Payer: Self-pay | Admitting: Obstetrics

## 2016-09-21 ENCOUNTER — Encounter: Payer: Self-pay | Admitting: Dietician

## 2016-09-21 DIAGNOSIS — Z3A30 30 weeks gestation of pregnancy: Secondary | ICD-10-CM | POA: Diagnosis not present

## 2016-09-21 DIAGNOSIS — E282 Polycystic ovarian syndrome: Secondary | ICD-10-CM | POA: Diagnosis not present

## 2016-09-21 DIAGNOSIS — Z713 Dietary counseling and surveillance: Secondary | ICD-10-CM | POA: Diagnosis not present

## 2016-09-21 DIAGNOSIS — O24419 Gestational diabetes mellitus in pregnancy, unspecified control: Secondary | ICD-10-CM | POA: Insufficient documentation

## 2016-09-21 DIAGNOSIS — G4733 Obstructive sleep apnea (adult) (pediatric): Secondary | ICD-10-CM | POA: Diagnosis not present

## 2016-09-21 DIAGNOSIS — O24410 Gestational diabetes mellitus in pregnancy, diet controlled: Secondary | ICD-10-CM

## 2016-09-21 NOTE — Progress Notes (Signed)
Subjective:  Katherine Preston is a 21 y.o. G1P0000 at [redacted]w[redacted]d being seen today for ongoing prenatal care.  She is currently monitored for the following issues for this high-risk pregnancy and has Obese; Supervision of high risk pregnancy, antepartum; GBS bacteriuria; Gestational diabetes mellitus (GDM) in third trimester; Positive fetal fibronectin at 22 weeks to [redacted] weeks gestation; and Premature dilation of cervix during pregnancy, antepartum on her problem list.  Patient reports backache and pressure.  Contractions: Not present. Vag. Bleeding: None.  Movement: Present. Denies leaking of fluid.   The following portions of the patient's history were reviewed and updated as appropriate: allergies, current medications, past family history, past medical history, past social history, past surgical history and problem list. Problem list updated.  Objective:   Vitals:   09/20/16 1621  BP: 113/84  Pulse: (!) 115  Weight: 258 lb 9.6 oz (117.3 kg)    Fetal Status: Fetal Heart Rate (bpm): 140   Movement: Present     General:  Alert, oriented and cooperative. Patient is in no acute distress.  Skin: Skin is warm and dry. No rash noted.   Cardiovascular: Normal heart rate noted  Respiratory: Normal respiratory effort, no problems with respiration noted  Abdomen: Soft, gravid, appropriate for gestational age. Pain/Pressure: Present     Pelvic:  Cervical exam deferred        Extremities: Normal range of motion.  Edema: None  Mental Status: Normal mood and affect. Normal behavior. Normal judgment and thought content.   Urinalysis:      Assessment and Plan:  1. Pregnancy: G1P0000 at [redacted]w[redacted]d  2. GDM - stable   3. Backache Rx: - Maternity Belt  There are no diagnoses linked to this encounter. Preterm labor symptoms and general obstetric precautions including but not limited to vaginal bleeding, contractions, leaking of fluid and fetal movement were reviewed in detail with the patient. Please refer  to After Visit Summary for other counseling recommendations.  Return in 1 week (on 09/27/2016).   Brock Bad, MDPatient ID: Katherine Preston, female   DOB: [birth year], 21 y.o.   MRN: [id number]

## 2016-09-21 NOTE — Progress Notes (Signed)
Diabetes Self-Management Education  Visit Type: First/Initial  Appt. Start Time: 0845 Appt. End Time: 1005  09/21/2016  [name], identified by name and date of birth, is a 21 y.o. female with a diagnosis of Diabetes: Gestational Diabetes.   Other hx includes PCOS and hx of OSA but patient reports that she did not use c-pap and is not symptomatic.  Her weight is less that at that time.  She is [redacted] weeks gestation.  Prepregnancy weight was 255 lbs.  Today's weight 258 lbs.  Discussed appropriate weight gain and healthy eating for GDM and to promote healthy weight gain.  ASSESSMENT  Height 5' 2.5" (1.588 m), weight 258 lb (117 kg), last menstrual period 02/22/2016. Body mass index is 46.44 kg/m.      Diabetes Self-Management Education - 09/21/16 0852      Visit Information   Visit Type First/Initial     Initial Visit   Diabetes Type Gestational Diabetes   Are you currently following a meal plan? Yes   What type of meal plan do you follow? eat every 2 hours and drinking a lot of water   Are you taking your medications as prescribed? Not on Medications   Date Diagnosed 09/06/16     Health Coping   How would you rate your overall health? Fair     Psychosocial Assessment   Patient Belief/Attitude about Diabetes Motivated to manage diabetes   Self-care barriers None   Self-management support Doctor's office;Family   Other persons present Patient;Family Member  mother   Patient Concerns Nutrition/Meal planning;Glycemic Control;Other (comment)  appropriate portions and about meter and testing as well as why did i develop GDM   Special Needs None   Preferred Learning Style No preference indicated   Learning Readiness Ready   How often do you need to have someone help you when you read instructions, pamphlets, or other written materials from your doctor or pharmacy? 1 - Never   What is the last grade level you completed in school? 12th grade     Pre-Education Assessment    Patient understands the diabetes disease and treatment process. Needs Instruction   Patient understands incorporating nutritional management into lifestyle. Needs Instruction   Patient undertands incorporating physical activity into lifestyle. Needs Instruction   Patient understands using medications safely. Needs Instruction   Patient understands monitoring blood glucose, interpreting and using results Needs Instruction   Patient understands prevention, detection, and treatment of acute complications. Needs Instruction   Patient understands prevention, detection, and treatment of chronic complications. Needs Instruction   Patient understands how to develop strategies to address psychosocial issues. Needs Instruction   Patient understands how to develop strategies to promote health/change behavior. Needs Instruction     Complications   How often do you check your blood sugar? 3-4 times/day   Fasting Blood glucose range (mg/dL) 70-129   Postprandial Blood glucose range (mg/dL) 70-129   Number of hypoglycemic episodes per month 0   Number of hyperglycemic episodes per week 0   Have you had a dilated eye exam in the past 12 months? No   Have you had a dental exam in the past 12 months? Yes   Are you checking your feet? Yes   How many days per week are you checking your feet? 7     Dietary Intake   Breakfast Captain Crunch cereal, skim lactaid milk, yoplait regular yogurt  8:30   Snack (morning) vitamin water   Lunch sandwich or hot dog (from  home) and fruit (grapes or mangos)  1:30-2   Snack (afternoon) corn on the cob   Dinner Mac and cheese or potatoes OR salad with croutons and meat  8pm   Snack (evening) occasional cookies (1-2)   Beverage(s) water, vitamin water, sparkling water, small amounts of cranberry or orange juice (up to 16 ounces per day)     Exercise   Exercise Type ADL's   How many days per week to you exercise? 0   How many minutes per day do you exercise? 0    Total minutes per week of exercise 0     Patient Education   Previous Diabetes Education No   Disease state  Factors that contribute to the development of diabetes;Other (comment)  explained GDM   Nutrition management  Role of diet in the treatment of diabetes and the relationship between the three main macronutrients and blood glucose level;Food label reading, portion sizes and measuring food.;Carbohydrate counting;Meal options for control of blood glucose level and chronic complications.;Information on hints to eating out and maintain blood glucose control.;Reviewed blood glucose goals for pre and post meals and how to evaluate the patients' food intake on their blood glucose level.   Physical activity and exercise  Other (comment)  she is on bed rest due to dilated cervix   Monitoring Identified appropriate SMBG and/or A1C goals.;Purpose and frequency of SMBG.;Other (comment)  Patient demonstrated testing on her meter with a CBG of 158 1.75 hours after a very high carbohydrate breakfast.   Acute complications Taught treatment of hypoglycemia - the 15 rule.   Chronic complications Relationship between chronic complications and blood glucose control   Psychosocial adjustment Worked with patient to identify barriers to care and solutions;Role of stress on diabetes   Preconception care Pregnancy and GDM  Role of pre-pregnancy blood glucose control on the development of the fetus   Personal strategies to promote health Lifestyle issues that need to be addressed for better diabetes care     Individualized Goals (developed by patient)   Nutrition Follow meal plan discussed   Physical Activity Not Applicable   Monitoring  test my blood glucose as discussed   Problem Solving eating healthy meals   Reducing Risk Other (comment)  reducing carbohydrate at breakfast and increasing protein with meals.   Health Coping discuss diabetes with (comment)  MD/RD     Post-Education Assessment   Patient  understands the diabetes disease and treatment process. Demonstrates understanding / competency   Patient understands incorporating nutritional management into lifestyle. Demonstrates understanding / competency   Patient undertands incorporating physical activity into lifestyle. Demonstrates understanding / competency   Patient understands using medications safely. Demonstrates understanding / competency   Patient understands monitoring blood glucose, interpreting and using results Demonstrates understanding / competency   Patient understands prevention, detection, and treatment of acute complications. Demonstrates understanding / competency   Patient understands prevention, detection, and treatment of chronic complications. Demonstrates understanding / competency   Patient understands how to develop strategies to address psychosocial issues. Demonstrates understanding / competency   Patient understands how to develop strategies to promote health/change behavior. Demonstrates understanding / competency     Outcomes   Expected Outcomes Demonstrated interest in learning. Expect positive outcomes   Future DMSE PRN   Program Status Completed      Individualized Plan for Diabetes Self-Management Training:   Learning Objective:  Patient will have a greater understanding of diabetes self-management. Patient education plan is to attend individual and/or group sessions per  assessed needs and concerns.   Plan:   Patient Instructions  Avoid foods with added sugar. Avoid beverages with added sugar. Choose lean protein.  Small amounts with each meal and snack. 3 meals and 3 snacks daily. Reduce the amount of carbohydrates at breakfast.  See your meal plan card. Check your blood sugar four times daily.  Before Breakfast </=95.  2 hours after you start a meal </=120.   Expected Outcomes:  Demonstrated interest in learning. Expect positive outcomes  Education material provided: Food label  handouts, Meal plan card, My Plate and Snack sheet , Nutrition During Pregnancy  If problems or questions, patient to contact team via:  Phone  Future DSME appointment: PRN

## 2016-09-21 NOTE — Patient Instructions (Signed)
Avoid foods with added sugar. Avoid beverages with added sugar. Choose lean protein.  Small amounts with each meal and snack. 3 meals and 3 snacks daily. Reduce the amount of carbohydrates at breakfast.  See your meal plan card. Check your blood sugar four times daily.  Before Breakfast </=95.  2 hours after you start a meal </=120.

## 2016-09-27 ENCOUNTER — Inpatient Hospital Stay (HOSPITAL_COMMUNITY)
Admission: AD | Admit: 2016-09-27 | Discharge: 2016-09-27 | Disposition: A | Discharge status: Home / Self Care | Payer: BLUE CROSS/BLUE SHIELD | Source: Ambulatory Visit | Attending: Obstetrics & Gynecology | Admitting: Obstetrics & Gynecology

## 2016-09-27 ENCOUNTER — Inpatient Hospital Stay (HOSPITAL_COMMUNITY): Payer: BLUE CROSS/BLUE SHIELD

## 2016-09-27 ENCOUNTER — Encounter (HOSPITAL_COMMUNITY): Payer: Self-pay | Admitting: *Deleted

## 2016-09-27 ENCOUNTER — Encounter: Payer: BLUE CROSS/BLUE SHIELD | Admitting: Certified Nurse Midwife

## 2016-09-27 DIAGNOSIS — R079 Chest pain, unspecified: Secondary | ICD-10-CM | POA: Diagnosis not present

## 2016-09-27 DIAGNOSIS — O219 Vomiting of pregnancy, unspecified: Secondary | ICD-10-CM

## 2016-09-27 DIAGNOSIS — R1011 Right upper quadrant pain: Secondary | ICD-10-CM

## 2016-09-27 DIAGNOSIS — I959 Hypotension, unspecified: Secondary | ICD-10-CM

## 2016-09-27 DIAGNOSIS — Z3A31 31 weeks gestation of pregnancy: Secondary | ICD-10-CM | POA: Insufficient documentation

## 2016-09-27 DIAGNOSIS — Z79899 Other long term (current) drug therapy: Secondary | ICD-10-CM | POA: Diagnosis not present

## 2016-09-27 DIAGNOSIS — O26893 Other specified pregnancy related conditions, third trimester: Secondary | ICD-10-CM | POA: Insufficient documentation

## 2016-09-27 DIAGNOSIS — O9A213 Injury, poisoning and certain other consequences of external causes complicating pregnancy, third trimester: Secondary | ICD-10-CM | POA: Diagnosis not present

## 2016-09-27 DIAGNOSIS — T50901A Poisoning by unspecified drugs, medicaments and biological substances, accidental (unintentional), initial encounter: Secondary | ICD-10-CM

## 2016-09-27 DIAGNOSIS — O99283 Endocrine, nutritional and metabolic diseases complicating pregnancy, third trimester: Secondary | ICD-10-CM | POA: Diagnosis not present

## 2016-09-27 DIAGNOSIS — E860 Dehydration: Secondary | ICD-10-CM | POA: Insufficient documentation

## 2016-09-27 DIAGNOSIS — O99343 Other mental disorders complicating pregnancy, third trimester: Secondary | ICD-10-CM | POA: Diagnosis not present

## 2016-09-27 DIAGNOSIS — O26613 Liver and biliary tract disorders in pregnancy, third trimester: Secondary | ICD-10-CM | POA: Insufficient documentation

## 2016-09-27 DIAGNOSIS — E119 Type 2 diabetes mellitus without complications: Secondary | ICD-10-CM | POA: Diagnosis not present

## 2016-09-27 DIAGNOSIS — O99353 Diseases of the nervous system complicating pregnancy, third trimester: Secondary | ICD-10-CM | POA: Insufficient documentation

## 2016-09-27 DIAGNOSIS — K8020 Calculus of gallbladder without cholecystitis without obstruction: Secondary | ICD-10-CM

## 2016-09-27 DIAGNOSIS — R42 Dizziness and giddiness: Secondary | ICD-10-CM

## 2016-09-27 HISTORY — DX: Calculus of bile duct without cholangitis or cholecystitis without obstruction: K80.50

## 2016-09-27 HISTORY — DX: Type 2 diabetes mellitus without complications: E11.9

## 2016-09-27 LAB — CBC
HEMATOCRIT: 31.3 % — AB (ref 36.0–46.0)
Hemoglobin: 10.3 g/dL — ABNORMAL LOW (ref 12.0–15.0)
MCH: 24.6 pg — ABNORMAL LOW (ref 26.0–34.0)
MCHC: 32.9 g/dL (ref 30.0–36.0)
MCV: 74.9 fL — AB (ref 78.0–100.0)
PLATELETS: 251 10*3/uL (ref 150–400)
RBC: 4.18 MIL/uL (ref 3.87–5.11)
RDW: 14.9 % (ref 11.5–15.5)
WBC: 10.2 10*3/uL (ref 4.0–10.5)

## 2016-09-27 LAB — COMPREHENSIVE METABOLIC PANEL
ALBUMIN: 2.6 g/dL — AB (ref 3.5–5.0)
ALT: 11 U/L — AB (ref 14–54)
AST: 15 U/L (ref 15–41)
Alkaline Phosphatase: 87 U/L (ref 38–126)
Anion gap: 8 (ref 5–15)
BUN: 6 mg/dL (ref 6–20)
CO2: 21 mmol/L — ABNORMAL LOW (ref 22–32)
Calcium: 8.2 mg/dL — ABNORMAL LOW (ref 8.9–10.3)
Chloride: 104 mmol/L (ref 101–111)
Creatinine, Ser: 0.55 mg/dL (ref 0.44–1.00)
GFR calc Af Amer: >60 mL/min (ref >60)
GFR calc non Af Amer: >60 mL/min (ref >60)
GLUCOSE: 82 mg/dL (ref 65–99)
Potassium: 3.8 mmol/L (ref 3.5–5.1)
Sodium: 133 mmol/L — ABNORMAL LOW (ref 135–145)
TOTAL PROTEIN: 5.9 g/dL — AB (ref 6.5–8.1)
Total Bilirubin: 0.4 mg/dL (ref 0.3–1.2)

## 2016-09-27 LAB — URINALYSIS, ROUTINE W REFLEX MICROSCOPIC
Bilirubin Urine: NEGATIVE
Glucose, UA: NEGATIVE mg/dL
HGB URINE DIPSTICK: NEGATIVE
KETONES UR: 80 mg/dL — AB
LEUKOCYTES UA: NEGATIVE
Nitrite: NEGATIVE
PROTEIN: NEGATIVE mg/dL
Specific Gravity, Urine: 1.011 (ref 1.005–1.030)
pH: 6 (ref 5.0–8.0)

## 2016-09-27 LAB — RAPID URINE DRUG SCREEN, HOSP PERFORMED
Amphetamines: NOT DETECTED
BARBITURATES: NOT DETECTED
Benzodiazepines: NOT DETECTED
Cocaine: NOT DETECTED
Opiates: NOT DETECTED
Tetrahydrocannabinol: NOT DETECTED

## 2016-09-27 LAB — FETAL FIBRONECTIN: Fetal Fibronectin: NEGATIVE

## 2016-09-27 LAB — WET PREP, GENITAL
SPERM: NONE SEEN
Trich, Wet Prep: NONE SEEN
Yeast Wet Prep HPF POC: NONE SEEN

## 2016-09-27 LAB — LIPASE, BLOOD: Lipase: 23 U/L (ref 11–51)

## 2016-09-27 MED ORDER — SODIUM CHLORIDE 0.9 % IV SOLN
INTRAVENOUS | Status: DC
  Administered 2016-09-27: 13:00:00 via INTRAVENOUS

## 2016-09-27 MED ORDER — SODIUM CHLORIDE 0.9 % IV BOLUS (SEPSIS)
1000.0000 mL | Freq: Once | INTRAVENOUS | Status: AC
  Administered 2016-09-27: 1000 mL via INTRAVENOUS

## 2016-09-27 MED ORDER — ONDANSETRON HCL 4 MG PO TABS: 4.0000 mg | ORAL_TABLET | Freq: Three times a day (TID) | ORAL | 0 refills | Status: AC | PRN

## 2016-09-27 MED ORDER — LACTATED RINGERS IV BOLUS (SEPSIS)
1000.0000 mL | Freq: Once | INTRAVENOUS | Status: AC
  Administered 2016-09-27: 1000 mL via INTRAVENOUS

## 2016-09-27 MED ORDER — ONDANSETRON HCL 4 MG/2ML IJ SOLN
4.0000 mg | Freq: Once | INTRAMUSCULAR | Status: AC
  Administered 2016-09-27: 4 mg via INTRAVENOUS
  Filled 2016-09-27: qty 2

## 2016-09-27 NOTE — MAU Note (Signed)
Pt arrived by EMS for upper abdominal pain she is rating 5/10.  Pain comes and goes and "I've had issues with my gall bladder."  Pt also states had chest pain this morning, but not now.  No difficulty breathing.  Pt took 20mg  of Flexaril at 0900.

## 2016-09-27 NOTE — MAU Provider Note (Signed)
Chief Complaint:  Abdominal Pain   None     HPI: Katherine Preston is a 21 y.o. G1P0 pt of [facility] at [redacted]w[redacted]d who presents to maternity admissions reporting she woke up this morning with dizziness, abdominal cramping, and nausea.  Her dizziness was constant. She did not try any treatments. She did not eat or drink anything this morning.  She was unable to eat or drink anything because she did not feel well. Her cramping started this morning and was intermittent in her low abdomin, associated with pelvic pressure.  She took 2 Flexeril tablets, 20 mg dose, this morning.  After the Flexeril, her dizziness worsened and she started having chest pain. Her cramping was unchanged.  She has recent hx of intermittent chest pain, diagnosed with possible cholecystitis per the pt. She has not had any imaging. The chest pain is intermittent, in her upper left chest and lower right chest and RUQ of her abdomen.  Today's chest pain is similar to the pain she has had intermittently x 5 months.  It resolved spontaneously upon arrival in MAU.  Eating usually makes the pain worse, Tramadol makes it better. She is out of Tramadol, which is why she took the Flexeril 20 mg this morning.  She was not aware that her dose should be 10 mg only. Of note, she has been on modified bed rest and taken out of work because her cervix was dilated to 1 cm at 28 weeks.  She reports good fetal movement, denies LOF, vaginal bleeding, vaginal itching/burning, urinary symptoms, h/a, or fever/chills.    HPI  Past Medical History: Past Medical History:  Diagnosis Date  . Allergic rhinitis   . Anxiety    per patient, no diagnosis  . Diabetes mellitus without complication (HCC)    diet controlled GDM  . Gall bladder pain   . Sleep apnea     Past obstetric history: OB History  Gravida Para Term Preterm AB Living  1 0 0 0 0 0  SAB TAB Ectopic Multiple Live Births  0 0 0 0      # Outcome Date GA Lbr Len/2nd Weight Sex Delivery Anes PTL  Lv  1 Current               Past Surgical History: Past Surgical History:  Procedure Laterality Date  . WISDOM TOOTH EXTRACTION      Family History: Family History  Problem Relation Age of Onset  . Fibroids Mother     Social History: Social History  Substance Use Topics  . Smoking status: Never Smoker  . Smokeless tobacco: Never Used  . Alcohol use 0.0 oz/week     Comment: Occassional -not with pregnancy    Allergies: No Known Allergies  Meds:  Prescriptions Prior to Admission  Medication Sig Dispense Refill Last Dose  . butalbital-acetaminophen-caffeine (FIORICET, ESGIC) 50-325-40 MG tablet Take 2 tablets by mouth every 6 (six) hours as needed for headache. 45 tablet 5 Past Week at Unknown time  . cyclobenzaprine (FLEXERIL) 10 MG tablet Take 1 tablet (10 mg total) by mouth every 8 (eight) hours as needed for muscle spasms. 30 tablet 1 09/27/2016 at Unknown time  . Prenatal Vit w/Fe-Methylfol-FA (PNV PO) Take 1 tablet by mouth daily.    09/26/2016 at Unknown time  . BAYER MICROLET LANCETS lancets Use as instructed - patient to test glucose level fasting and 2 hours after each meal 100 each prn Taking  . Blood Glucose Monitoring Suppl (BAYER CONTOUR NEXT  MONITOR) w/Device KIT 1 Device by Does not apply route 4 (four) times daily. Check capillary blood glucose 4 times daily 1 kit 0 Taking  . Elastic Bandages & Supports (COMFORT FIT MATERNITY SUPP LG) MISC 1 Units by Does not apply route daily. 1 each 0 Taking  . glucose blood (BAYER CONTOUR NEXT TEST) test strip Check capillary blood glucose 4 times daily 100 each 11 Taking    ROS:  Review of Systems  Constitutional: Negative for chills, fatigue and fever.  Eyes: Negative for visual disturbance.  Respiratory: Negative for shortness of breath.   Cardiovascular: Positive for chest pain.  Gastrointestinal: Positive for abdominal pain. Negative for nausea and vomiting.  Genitourinary: Positive for pelvic pain. Negative for  difficulty urinating, dysuria, flank pain, vaginal bleeding, vaginal discharge and vaginal pain.  Neurological: Positive for dizziness and light-headedness. Negative for headaches.  Psychiatric/Behavioral: Negative.      I have reviewed patient's Past Medical Hx, Surgical Hx, Family Hx, Social Hx, medications and allergies.   Physical Exam   Patient Vitals for the past 24 hrs:  BP Temp Temp src Pulse Resp SpO2  09/27/16 1639 (!) 93/48 - - 92 - -  09/27/16 1600 (!) 84/52 - - 99 - -  09/27/16 1529 - - - 91 - 100 %  09/27/16 1431 (!) 85/42 - - 88 - 99 %  09/27/16 1426 (!) 83/46 - - 96 - 100 %  09/27/16 1422 119/64 - - 94 - 100 %  09/27/16 1421 119/64 - - 95 - 100 %  09/27/16 1419 (!) 125/54 - - (!) 106 - -  09/27/16 1417 (!) 122/58 - - (!) 111 - -  09/27/16 1416 124/64 - - 94 - 99 %  09/27/16 1415 (!) 78/40 - - (!) 102 - -  09/27/16 1350 (!) 92/47 - - 85 - 100 %  09/27/16 1330 (!) 81/53 - - 76 - 100 %  09/27/16 1326 (!) 87/72 - - 93 - 100 %  09/27/16 1321 (!) 73/44 - - 87 - 100 %  09/27/16 1311 (!) 87/45 - - 89 - 99 %  09/27/16 1306 (!) 92/49 - - 88 - 100 %  09/27/16 1301 (!) 92/41 - - 88 - 100 %  09/27/16 1256 99/61 - - 88 - 100 %  09/27/16 1246 (!) 82/40 - - 83 - 100 %  09/27/16 1241 (!) 91/53 - - 80 - 100 %  09/27/16 1236 (!) 90/51 - - 87 - 100 %  09/27/16 1231 (!) 84/37 - - 76 - 100 %  09/27/16 1226 (!) 84/44 - - 86 - 100 %  09/27/16 1221 (!) 76/37 - - 94 - 100 %  09/27/16 1219 (!) 76/35 - - 92 - -  09/27/16 1201 (!) 84/46 - - 84 - 99 %  09/27/16 1157 (!) 91/39 - - 92 - -  09/27/16 1151 (!) 76/64 - - 94 - 99 %  09/27/16 1146 (!) 83/48 - - 86 - 100 %  09/27/16 1141 (!) 70/35 - - 81 - 97 %  09/27/16 1136 (!) 82/42 - - 86 - 100 %  09/27/16 1134 (!) 86/47 - - 86 - -  09/27/16 1110 (!) 79/40 - - 95 - -  09/27/16 1101 (!) 92/56 - - (!) 106 - 99 %  09/27/16 1049 (!) 91/49 - - 90 20 98 %  09/27/16 1045 (!) 85/40 98.5 F (36.9 C) Oral 93 20 97 %  09/27/16 1043 (!) 77/41 - -  Marland Kitchen)  130 - -    Orthostatic VS for the past 24 hrs:  BP- Lying Pulse- Lying BP- Sitting Pulse- Sitting BP- Standing at 0 minutes Pulse- Standing at 0 minutes  09/27/16 1412 (!) 78/40 97 124/64 95 122/58 111     Constitutional: Well-developed, well-nourished female in moderate distress.  HEART: normal rate, heart sounds, regular rhythm RESP: normal effort, lung sounds clear and equal bilaterally GI: Abd soft, non-tender, gravid appropriate for gestational age.  MS: Extremities nontender, no edema, normal ROM Neurologic: Alert and oriented x 4.  GU: Neg CVAT.  PELVIC EXAM: Cervix pink, visually closed, without lesion, scant white creamy discharge, vaginal walls and external genitalia normal Bimanual exam: Cervix 0/long/high, firm, anterior, neg CMT, uterus nontender, nonenlarged, adnexa without tenderness, enlargement, or mass  Dilation: Closed Effacement (%): Thick Exam by:: Fatima Blank, CNM  FHT:  Baseline 145 , moderate variability, accelerations present, no decelerations Contractions: None on toco or to palpation   Labs: Results for orders placed or performed during the hospital encounter of 09/27/16 (from the past 24 hour(s))  Fetal fibronectin     Status: None   Collection Time: 09/27/16 11:08 AM  Result Value Ref Range   Fetal Fibronectin NEGATIVE NEGATIVE  Wet prep, genital     Status: Abnormal   Collection Time: 09/27/16 11:08 AM  Result Value Ref Range   Yeast Wet Prep HPF POC NONE SEEN NONE SEEN   Trich, Wet Prep NONE SEEN NONE SEEN   Clue Cells Wet Prep HPF POC PRESENT (A) NONE SEEN   WBC, Wet Prep HPF POC MODERATE (A) NONE SEEN   Sperm NONE SEEN   CBC     Status: Abnormal   Collection Time: 09/27/16 11:58 AM  Result Value Ref Range   WBC 10.2 4.0 - 10.5 K/uL   RBC 4.18 3.87 - 5.11 MIL/uL   Hemoglobin 10.3 (L) 12.0 - 15.0 g/dL   HCT 31.3 (L) 36.0 - 46.0 %   MCV 74.9 (L) 78.0 - 100.0 fL   MCH 24.6 (L) 26.0 - 34.0 pg   MCHC 32.9 30.0 - 36.0 g/dL   RDW  14.9 11.5 - 15.5 %   Platelets 251 150 - 400 K/uL  Comprehensive metabolic panel     Status: Abnormal   Collection Time: 09/27/16 11:58 AM  Result Value Ref Range   Sodium 133 (L) 135 - 145 mmol/L   Potassium 3.8 3.5 - 5.1 mmol/L   Chloride 104 101 - 111 mmol/L   CO2 21 (L) 22 - 32 mmol/L   Glucose, Bld 82 65 - 99 mg/dL   BUN 6 6 - 20 mg/dL   Creatinine, Ser 0.55 0.44 - 1.00 mg/dL   Calcium 8.2 (L) 8.9 - 10.3 mg/dL   Total Protein 5.9 (L) 6.5 - 8.1 g/dL   Albumin 2.6 (L) 3.5 - 5.0 g/dL   AST 15 15 - 41 U/L   ALT 11 (L) 14 - 54 U/L   Alkaline Phosphatase 87 38 - 126 U/L   Total Bilirubin 0.4 0.3 - 1.2 mg/dL   GFR calc non Af Amer >60 >60 mL/min   GFR calc Af Amer >60 >60 mL/min   Anion gap 8 5 - 15  Lipase, blood     Status: None   Collection Time: 09/27/16 11:58 AM  Result Value Ref Range   Lipase 23 11 - 51 U/L  Urine rapid drug screen (hosp performed)     Status: None   Collection Time: 09/27/16  3:50 PM  Result Value Ref Range   Opiates NONE DETECTED NONE DETECTED   Cocaine NONE DETECTED NONE DETECTED   Benzodiazepines NONE DETECTED NONE DETECTED   Amphetamines NONE DETECTED NONE DETECTED   Tetrahydrocannabinol NONE DETECTED NONE DETECTED   Barbiturates NONE DETECTED NONE DETECTED  Urinalysis, Routine w reflex microscopic     Status: Abnormal   Collection Time: 09/27/16  3:50 PM  Result Value Ref Range   Color, Urine YELLOW YELLOW   APPearance CLEAR CLEAR   Specific Gravity, Urine 1.011 1.005 - 1.030   pH 6.0 5.0 - 8.0   Glucose, UA NEGATIVE NEGATIVE mg/dL   Hgb urine dipstick NEGATIVE NEGATIVE   Bilirubin Urine NEGATIVE NEGATIVE   Ketones, ur 80 (A) NEGATIVE mg/dL   Protein, ur NEGATIVE NEGATIVE mg/dL   Nitrite NEGATIVE NEGATIVE   Leukocytes, UA NEGATIVE NEGATIVE   B/Positive/-- (12/18 1142)  Imaging:   US Abdomen Limited Ruq  Result Date: 09/27/2016 CLINICAL DATA:  21 year old female with colicky right upper quadrant abdominal pain since 0700 hours today.  Associated nausea. Pregnant, in the third trimester. EXAM: US ABDOMEN LIMITED - RIGHT UPPER QUADRANT COMPARISON:  Ob ultrasound 09/15/2016. Report of [facility] CT Abdomen and Pelvis 12/06/2013 (no images available). FINDINGS: Gallbladder: Numerous small shadowing echogenic gallstones (image 29). Individual stones size estimated at 8 mm. Gallbladder wall thickness remains normal. No pericholecystic fluid. No sonographic Murphy sign elicited. Common bile duct: Diameter: 3-4 mm, normal. Liver: No focal lesion identified. Within normal limits in parenchymal echogenicity. Other findings: Negative visible right kidney. IMPRESSION: Positive for cholelithiasis, but negative for evidence of acute cholecystitis or biliary obstruction. Electronically Signed   By: Genevie Ann M.D.   On: 09/27/2016 13:10    MAU Course/MDM: I have ordered labs and reviewed results.  NST reviewed and reactive No evidence of preterm labor with cervix FT, unchanged from previous exams and negative FFN today EKG slightly abnormal so cardiologist on call consulted and nonspecific changes not c/w cardiac problem in young patient so no follow up recommended at this time Pt in MAU x several hours with normal VS and normal FHR tracing Consult Dr Roselie Awkward with presentation, exam findings and test results.  Treatments in MAU included IV fluids x 3000 ml.  Hypotension improved with fluids.  Pt encouraged to increase PO fluids significantly. Zofran 4 mg PO Q 8 hours PRN for nausea. Pt to take Flexeril as prescribed, no more than 10 mg dose Q 8 hours. Pt to f/u at [facility] this week, return to MAU as needed for emergencies Reviewed dietary changes for cholelithiasis with pt today Pt stable at time of discharge.   Assessment: 1. Cholelithiasis affecting pregnancy in third trimester, antepartum   2. Colicky RUQ abdominal pain   3. Mild dehydration   4. Dizziness   5. Accidental medication overdose, initial encounter   6. Nausea and vomiting  during pregnancy   7. Hypotension, unspecified hypotension type     Plan: Discharge home Labor precautions and fetal kick counts  Allergies as of 09/27/2016   No Known Allergies     Medication List    TAKE these medications   BAYER CONTOUR NEXT MONITOR w/Device Kit 1 Device by Does not apply route 4 (four) times daily. Check capillary blood glucose 4 times daily   BAYER MICROLET LANCETS lancets Use as instructed - patient to test glucose level fasting and 2 hours after each meal   butalbital-acetaminophen-caffeine 50-325-40 MG tablet Commonly known as:  FIORICET, ESGIC Take 2 tablets by mouth  every 6 (six) hours as needed for headache.   COMFORT FIT MATERNITY SUPP LG Misc 1 Units by Does not apply route daily.   cyclobenzaprine 10 MG tablet Commonly known as:  FLEXERIL Take 1 tablet (10 mg total) by mouth every 8 (eight) hours as needed for muscle spasms.   glucose blood test strip Commonly known as:  BAYER CONTOUR NEXT TEST Check capillary blood glucose 4 times daily   PNV PO Take 1 tablet by mouth daily.       Fatima Blank Certified Nurse-Midwife 09/27/2016 4:55 PM

## 2016-09-27 NOTE — Progress Notes (Signed)
Pt called out and stated she was having SOB and chest pain that has returned. Pts O2 was 100% on room air and RR was 19 and did not appear to be in distress. HR was 80s. Listened to lung sounds and they were clear bilaterally. Notified provider Arbie CookeyLisa Leftwhich Kirby CNM and she plans to reevaluate pt.

## 2016-09-27 NOTE — Discharge Instructions (Signed)
Low-Fat Diet for Pancreatitis or Gallbladder Conditions A low-fat diet can be helpful if you have pancreatitis or a gallbladder condition. With these conditions, your pancreas and gallbladder have trouble digesting fats. A healthy eating plan with less fat will help rest your pancreas and gallbladder and reduce your symptoms. What do I need to know about this diet?  Eat a low-fat diet. ? Reduce your fat intake to less than 20-30% of your total daily calories. This is less than 50-60 g of fat per day. ? Remember that you need some fat in your diet. Ask your dietician what your daily goal should be. ? Choose nonfat and low-fat healthy foods. Look for the words "nonfat," "low fat," or "fat free." ? As a guide, look on the label and choose foods with less than 3 g of fat per serving. Eat only one serving.  Avoid alcohol.  Do not smoke. If you need help quitting, talk with your health care provider.  Eat small frequent meals instead of three large heavy meals. What foods can I eat? Grains Include healthy grains and starches such as potatoes, wheat bread, fiber-rich cereal, and brown rice. Choose whole grain options whenever possible. In adults, whole grains should account for 45-65% of your daily calories. Fruits and Vegetables Eat plenty of fruits and vegetables. Fresh fruits and vegetables add fiber to your diet. Meats and Other Protein Sources Eat lean meat such as chicken and pork. Trim any fat off of meat before cooking it. Eggs, fish, and beans are other sources of protein. In adults, these foods should account for 10-35% of your daily calories. Dairy Choose low-fat milk and dairy options. Dairy includes fat and protein, as well as calcium. Fats and Oils Limit high-fat foods such as fried foods, sweets, baked goods, sugary drinks. Other Creamy sauces and condiments, such as mayonnaise, can add extra fat. Think about whether or not you need to use them, or use smaller amounts or low fat  options. What foods are not recommended?  High fat foods, such as: ? Baked goods. ? Ice cream. ? French toast. ? Sweet rolls. ? Pizza. ? Cheese bread. ? Foods covered with batter, butter, creamy sauces, or cheese. ? Fried foods. ? Sugary drinks and desserts.  Foods that cause gas or bloating This information is not intended to replace advice given to you by your health care provider. Make sure you discuss any questions you have with your health care provider. Document Released: 05/15/2013 Document Revised: 10/16/2015 Document Reviewed: 04/23/2013 Elsevier Interactive Patient Education  2017 [employer]  

## 2016-10-08 ENCOUNTER — Ambulatory Visit (INDEPENDENT_AMBULATORY_CARE_PROVIDER_SITE_OTHER): Payer: BLUE CROSS/BLUE SHIELD | Admitting: Certified Nurse Midwife

## 2016-10-08 ENCOUNTER — Other Ambulatory Visit (HOSPITAL_COMMUNITY)
Admission: RE | Admit: 2016-10-08 | Discharge: 2016-10-08 | Disposition: A | Discharge status: Home / Self Care | Payer: BLUE CROSS/BLUE SHIELD | Source: Ambulatory Visit | Attending: Certified Nurse Midwife | Admitting: Certified Nurse Midwife

## 2016-10-08 VITALS — BP 112/77 | HR 93 | Wt 260.9 lb

## 2016-10-08 DIAGNOSIS — O3430 Maternal care for cervical incompetence, unspecified trimester: Secondary | ICD-10-CM

## 2016-10-08 DIAGNOSIS — O09899 Supervision of other high risk pregnancies, unspecified trimester: Secondary | ICD-10-CM

## 2016-10-08 DIAGNOSIS — B373 Candidiasis of vulva and vagina: Secondary | ICD-10-CM

## 2016-10-08 DIAGNOSIS — R8271 Bacteriuria: Secondary | ICD-10-CM

## 2016-10-08 DIAGNOSIS — Z6841 Body Mass Index (BMI) 40.0 and over, adult: Secondary | ICD-10-CM

## 2016-10-08 DIAGNOSIS — O0990 Supervision of high risk pregnancy, unspecified, unspecified trimester: Secondary | ICD-10-CM

## 2016-10-08 DIAGNOSIS — O24410 Gestational diabetes mellitus in pregnancy, diet controlled: Secondary | ICD-10-CM

## 2016-10-08 DIAGNOSIS — R8789 Other abnormal findings in specimens from female genital organs: Secondary | ICD-10-CM

## 2016-10-08 DIAGNOSIS — O24415 Gestational diabetes mellitus in pregnancy, controlled by oral hypoglycemic drugs: Secondary | ICD-10-CM

## 2016-10-08 DIAGNOSIS — Z3A00 Weeks of gestation of pregnancy not specified: Secondary | ICD-10-CM | POA: Insufficient documentation

## 2016-10-08 DIAGNOSIS — B3731 Acute candidiasis of vulva and vagina: Secondary | ICD-10-CM

## 2016-10-08 MED ORDER — TERCONAZOLE 0.8 % VA CREA: 1.0000 | TOPICAL_CREAM | Freq: Every day | VAGINAL | 0 refills | Status: DC

## 2016-10-08 MED ORDER — GLYBURIDE 2.5 MG PO TABS: 2.5000 mg | ORAL_TABLET | Freq: Two times a day (BID) | ORAL | 3 refills | Status: DC

## 2016-10-08 MED ORDER — FLUCONAZOLE 150 MG PO TABS: 150.0000 mg | ORAL_TABLET | Freq: Once | ORAL | 0 refills | Status: AC

## 2016-10-08 NOTE — Progress Notes (Signed)
Patient rpeorts good fetal movement and feeling pressure, denies contractions. Pt states that she had brown discharge 2 days ago and now has vaginal itching.

## 2016-10-08 NOTE — Progress Notes (Addendum)
PRENATAL VISIT NOTE  Subjective:  Katherine Preston is a 21 y.o. G1P0000 at 3822w5d being seen today for ongoing prenatal care.  She is currently monitored for the following issues for this high-risk pregnancy and has Obese; Supervision of high risk pregnancy, antepartum; GBS bacteriuria; Gestational diabetes mellitus (GDM) in third trimester; Positive fetal fibronectin at 22 weeks to [redacted] weeks gestation; and Premature dilation of cervix during pregnancy, antepartum on her problem list.  Patient reports nausea, no bleeding, no contractions, no cramping, no leaking, vaginal irritation and vomiting.  Contractions: Not present. Vag. Bleeding: None.  Movement: Present. Denies leaking of fluid.   The following portions of the patient's history were reviewed and updated as appropriate: allergies, current medications, past family history, past medical history, past social history, past surgical history and problem list. Problem list updated.  Objective:   Vitals:   10/08/16 0947  BP: 112/77  Pulse: 93  Weight: 260 lb 14.4 oz (118.3 kg)    Fetal Status: Fetal Heart Rate (bpm): 145 Fundal Height: 34 cm Movement: Present     General:  Alert, oriented and cooperative. Patient is in no acute distress.  Skin: Skin is warm and dry. No rash noted.   Cardiovascular: Normal heart rate noted  Respiratory: Normal respiratory effort, no problems with respiration noted  Abdomen: Soft, gravid, appropriate for gestational age. Pain/Pressure: Present     Pelvic:  Cervical exam performed Dilation: Fingertip Effacement (%): 50 Station: Ballotable  Extremities: Normal Preston of motion.  Edema: None  Mental Status: Normal mood and affect. Normal behavior. Normal judgment and thought content.   Assessment and Plan:  Pregnancy: G1P0000 at 5222w5d  1. Supervision of high risk pregnancy, antepartum     Has f/u US scheduled for 10/13/16 - Cervicovaginal ancillary only  2. GBS bacteriuria     PCN for labor  3.  Positive fetal fibronectin at 22 weeks to [redacted] weeks gestation      Most recently negative, unchanged cervix from previous exam.   4. Class 3 severe obesity due to excess calories without serious comorbidity with body mass index (BMI) of 45.0 to 49.9 in adult [facility](HCC)     Has lost 5 lbs this pregnancy  5. Diet controlled gestational diabetes mellitus (GDM) in third trimester     Started on Glyburide today BID, 2.5 mg, encouraged to follow diet and increase water intake.   6. Premature dilation of cervix during pregnancy, antepartum    Unchanged from previous exam  7. Gestational diabetes mellitus (GDM) in third trimester controlled on oral hypoglycemic drug     Start antenatal testing next week.  Reports Fasting CBGs: 94, 2 hour PP if she drinks water between: 122-134, if no water 158-165.  Strongly encouraged to bring blood sugar log with her to her next appointment.  Counseling given on oral medications.  - glyBURIDE (DIABETA) 2.5 MG tablet; Take 1 tablet (2.5 mg total) by mouth 2 (two) times daily with a meal.  Dispense: 60 tablet; Refill: 3  8. Yeast infection of the vagina     - terconazole (TERAZOL 3) 0.8 % vaginal cream; Place 1 applicator vaginally at bedtime.  Dispense: 20 g; Refill: 0 - fluconazole (DIFLUCAN) 150 MG tablet; Take 1 tablet (150 mg total) by mouth once.  Dispense: 1 tablet; Refill: 0  Preterm labor symptoms and general obstetric precautions including but not limited to vaginal bleeding, contractions, leaking of fluid and fetal movement were reviewed in detail with the patient. Please refer to After Visit  Summary for other counseling recommendations.  Return in about 1 week (around 10/15/2016) for Needs to see FP MD here, HOB, Bi-weekly Antenatal testing.   Roe Coombs, CNM

## 2016-10-12 LAB — CERVICOVAGINAL ANCILLARY ONLY
Bacterial vaginitis: NEGATIVE
Candida vaginitis: NEGATIVE
Chlamydia: NEGATIVE
NEISSERIA GONORRHEA: NEGATIVE
Trichomonas: NEGATIVE

## 2016-10-12 NOTE — Addendum Note (Signed)
Addended by: Natale MilchSTALLING, Sadako Cegielski D on: 10/12/2016 11:46 AM   Modules accepted: Orders

## 2016-10-12 NOTE — Addendum Note (Signed)
Addended by: Natale MilchSTALLING, BRITTANY D on: 10/12/2016 11:53 AM   Modules accepted: Orders

## 2016-10-13 ENCOUNTER — Other Ambulatory Visit (HOSPITAL_COMMUNITY): Payer: Self-pay | Admitting: *Deleted

## 2016-10-13 ENCOUNTER — Ambulatory Visit (HOSPITAL_COMMUNITY)
Admission: RE | Admit: 2016-10-13 | Discharge: 2016-10-13 | Disposition: A | Discharge status: Home / Self Care | Payer: BLUE CROSS/BLUE SHIELD | Source: Ambulatory Visit | Attending: Certified Nurse Midwife | Admitting: Certified Nurse Midwife

## 2016-10-13 ENCOUNTER — Encounter (HOSPITAL_COMMUNITY): Payer: Self-pay

## 2016-10-13 ENCOUNTER — Ambulatory Visit (INDEPENDENT_AMBULATORY_CARE_PROVIDER_SITE_OTHER): Payer: BLUE CROSS/BLUE SHIELD | Admitting: Obstetrics and Gynecology

## 2016-10-13 VITALS — BP 111/74 | HR 91 | Wt 261.4 lb

## 2016-10-13 DIAGNOSIS — O0993 Supervision of high risk pregnancy, unspecified, third trimester: Secondary | ICD-10-CM

## 2016-10-13 DIAGNOSIS — O26873 Cervical shortening, third trimester: Secondary | ICD-10-CM | POA: Insufficient documentation

## 2016-10-13 DIAGNOSIS — R8789 Other abnormal findings in specimens from female genital organs: Secondary | ICD-10-CM

## 2016-10-13 DIAGNOSIS — O24415 Gestational diabetes mellitus in pregnancy, controlled by oral hypoglycemic drugs: Secondary | ICD-10-CM | POA: Insufficient documentation

## 2016-10-13 DIAGNOSIS — O3433 Maternal care for cervical incompetence, third trimester: Secondary | ICD-10-CM

## 2016-10-13 DIAGNOSIS — Z23 Encounter for immunization: Secondary | ICD-10-CM | POA: Diagnosis not present

## 2016-10-13 DIAGNOSIS — O99213 Obesity complicating pregnancy, third trimester: Secondary | ICD-10-CM | POA: Diagnosis not present

## 2016-10-13 DIAGNOSIS — Z3A33 33 weeks gestation of pregnancy: Secondary | ICD-10-CM | POA: Diagnosis not present

## 2016-10-13 DIAGNOSIS — O09899 Supervision of other high risk pregnancies, unspecified trimester: Secondary | ICD-10-CM

## 2016-10-13 DIAGNOSIS — O99210 Obesity complicating pregnancy, unspecified trimester: Secondary | ICD-10-CM

## 2016-10-13 DIAGNOSIS — O3430 Maternal care for cervical incompetence, unspecified trimester: Secondary | ICD-10-CM

## 2016-10-13 DIAGNOSIS — O0990 Supervision of high risk pregnancy, unspecified, unspecified trimester: Secondary | ICD-10-CM

## 2016-10-13 DIAGNOSIS — R8271 Bacteriuria: Secondary | ICD-10-CM

## 2016-10-13 NOTE — ED Notes (Signed)
During ultrasound pt had dizziness with blurred vision.  BP 107/66, pulse 74, blood sugar 118.  Pt began to feel better, continues to have visual changes.  Pt taken to NST room for NST.  Dr. Vincenza HewsQuinn aware.

## 2016-10-13 NOTE — Procedures (Signed)
Katherine [name] 14-Dec-1995 4673w3d  Fetus A Non-Stress Test Interpretation for 10/13/16  Indication: Unsatisfactory BPP  Fetal Heart Rate A Mode: External Baseline Rate (A): 125 bpm Variability: Moderate Accelerations: 15 x 15 Decelerations: None  Uterine Activity Mode: Toco Contraction Frequency (min): mild UI noted Contraction Duration (sec): 20-40 Contraction Quality: Mild Resting Tone Palpated: Relaxed Resting Time: Adequate  Interpretation (Fetal Testing) Nonstress Test Interpretation: Reactive Comments: FHR tracing rev'd by Dr. Vincenza HewsQuinn

## 2016-10-13 NOTE — Progress Notes (Signed)
Pt reports no fetal movement since midday yesterday.

## 2016-10-13 NOTE — ED Notes (Signed)
Pt reports no dizziness or blurred vision.  Pt to keep OB visit in the office later today.

## 2016-10-13 NOTE — Progress Notes (Signed)
GDM pt presents for ROB. Does not have CBG log today. NST and BPP completed at the hospital today.

## 2016-10-13 NOTE — Progress Notes (Signed)
Subjective:  Rod CanYvonne Cirillo is a 21 y.o. G1P0000 at 4311w3d being seen today for ongoing prenatal care.  She is currently monitored for the following issues for this high-risk pregnancy and has Obese; Supervision of high risk pregnancy, antepartum; GBS bacteriuria; Gestational diabetes mellitus (GDM) in third trimester; Positive fetal fibronectin at 22 weeks to [redacted] weeks gestation; and Premature dilation of cervix during pregnancy, antepartum on her problem list.  Patient reports no complaints.  Contractions: Not present. Vag. Bleeding: None.  Movement: Present. Denies leaking of fluid.   The following portions of the patient's history were reviewed and updated as appropriate: allergies, current medications, past family history, past medical history, past social history, past surgical history and problem list. Problem list updated.  Objective:   Vitals:   10/13/16 1415  BP: 111/74  Pulse: 91  Weight: 261 lb 6.4 oz (118.6 kg)    Fetal Status:     Movement: Present     General:  Alert, oriented and cooperative. Patient is in no acute distress.  Skin: Skin is warm and dry. No rash noted.   Cardiovascular: Normal heart rate noted  Respiratory: Normal respiratory effort, no problems with respiration noted  Abdomen: Soft, gravid, appropriate for gestational age. Pain/Pressure: Present     Pelvic:  Cervical exam deferred        Extremities: Normal range of motion.  Edema: None  Mental Status: Normal mood and affect. Normal behavior. Normal judgment and thought content.   Urinalysis: Urine Protein: Negative Urine Glucose: Negative  Assessment and Plan:  Pregnancy: G1P0000 at 5611w3d  1. Supervision of high risk pregnancy, antepartum Stable  2. GBS bacteriuria Tx while in labor  3. Gestational diabetes mellitus (GDM) in third trimester controlled on oral hypoglycemic drug Did not bring BS Importance of bringing BS readings to visit Continue with antenatal testing - US MFM FETAL BPP WO  NON STRESS; Standing  4. Premature dilation of cervix during pregnancy, antepartum Stable  5. Positive fetal fibronectin at 22 weeks to [redacted] weeks gestation Stable, S/P BMZ  Preterm labor symptoms and general obstetric precautions including but not limited to vaginal bleeding, contractions, leaking of fluid and fetal movement were reviewed in detail with the patient. Please refer to After Visit Summary for other counseling recommendations.  Return in about 1 week (around 10/20/2016) for OB visit.   Hermina StaggersErvin, Jennene Downie L, MD

## 2016-10-15 ENCOUNTER — Other Ambulatory Visit: Payer: Self-pay | Admitting: Certified Nurse Midwife

## 2016-10-15 ENCOUNTER — Encounter: Payer: BLUE CROSS/BLUE SHIELD | Admitting: Certified Nurse Midwife

## 2016-10-19 ENCOUNTER — Telehealth: Payer: Self-pay

## 2016-10-19 NOTE — Telephone Encounter (Signed)
Scheduled patient OB/NST appt for next week due to pt is out of town.

## 2016-10-25 ENCOUNTER — Encounter (HOSPITAL_COMMUNITY): Payer: Self-pay | Admitting: *Deleted

## 2016-10-25 ENCOUNTER — Inpatient Hospital Stay (HOSPITAL_COMMUNITY): Payer: BLUE CROSS/BLUE SHIELD

## 2016-10-25 ENCOUNTER — Inpatient Hospital Stay (HOSPITAL_COMMUNITY)
Admission: AD | Admit: 2016-10-25 | Discharge: 2016-10-25 | Disposition: A | Discharge status: Home / Self Care | Payer: BLUE CROSS/BLUE SHIELD | Source: Ambulatory Visit | Attending: Obstetrics and Gynecology | Admitting: Obstetrics and Gynecology

## 2016-10-25 DIAGNOSIS — O9989 Other specified diseases and conditions complicating pregnancy, childbirth and the puerperium: Secondary | ICD-10-CM

## 2016-10-25 DIAGNOSIS — O26893 Other specified pregnancy related conditions, third trimester: Secondary | ICD-10-CM | POA: Diagnosis not present

## 2016-10-25 DIAGNOSIS — K5900 Constipation, unspecified: Secondary | ICD-10-CM | POA: Insufficient documentation

## 2016-10-25 DIAGNOSIS — Z9889 Other specified postprocedural states: Secondary | ICD-10-CM | POA: Insufficient documentation

## 2016-10-25 DIAGNOSIS — K8020 Calculus of gallbladder without cholecystitis without obstruction: Secondary | ICD-10-CM | POA: Diagnosis not present

## 2016-10-25 DIAGNOSIS — O26899 Other specified pregnancy related conditions, unspecified trimester: Secondary | ICD-10-CM

## 2016-10-25 DIAGNOSIS — R8271 Bacteriuria: Secondary | ICD-10-CM

## 2016-10-25 DIAGNOSIS — O26613 Liver and biliary tract disorders in pregnancy, third trimester: Secondary | ICD-10-CM | POA: Insufficient documentation

## 2016-10-25 DIAGNOSIS — R109 Unspecified abdominal pain: Secondary | ICD-10-CM | POA: Insufficient documentation

## 2016-10-25 DIAGNOSIS — O99353 Diseases of the nervous system complicating pregnancy, third trimester: Secondary | ICD-10-CM | POA: Diagnosis not present

## 2016-10-25 DIAGNOSIS — R42 Dizziness and giddiness: Secondary | ICD-10-CM | POA: Diagnosis present

## 2016-10-25 DIAGNOSIS — Z8489 Family history of other specified conditions: Secondary | ICD-10-CM | POA: Diagnosis not present

## 2016-10-25 DIAGNOSIS — O0990 Supervision of high risk pregnancy, unspecified, unspecified trimester: Secondary | ICD-10-CM

## 2016-10-25 DIAGNOSIS — O24419 Gestational diabetes mellitus in pregnancy, unspecified control: Secondary | ICD-10-CM | POA: Diagnosis not present

## 2016-10-25 DIAGNOSIS — G4730 Sleep apnea, unspecified: Secondary | ICD-10-CM | POA: Insufficient documentation

## 2016-10-25 DIAGNOSIS — Z3A35 35 weeks gestation of pregnancy: Secondary | ICD-10-CM | POA: Insufficient documentation

## 2016-10-25 DIAGNOSIS — N949 Unspecified condition associated with female genital organs and menstrual cycle: Secondary | ICD-10-CM

## 2016-10-25 LAB — URINALYSIS, ROUTINE W REFLEX MICROSCOPIC
Bilirubin Urine: NEGATIVE
GLUCOSE, UA: NEGATIVE mg/dL
Hgb urine dipstick: NEGATIVE
Ketones, ur: NEGATIVE mg/dL
Nitrite: NEGATIVE
PROTEIN: NEGATIVE mg/dL
Specific Gravity, Urine: 1.021 (ref 1.005–1.030)
pH: 6 (ref 5.0–8.0)

## 2016-10-25 LAB — CBC WITH DIFFERENTIAL/PLATELET
BASOS ABS: 0 10*3/uL (ref 0.0–0.1)
Basophils Relative: 0 %
Eosinophils Absolute: 0.2 10*3/uL (ref 0.0–0.7)
Eosinophils Relative: 2 %
HEMATOCRIT: 32.5 % — AB (ref 36.0–46.0)
Hemoglobin: 10.8 g/dL — ABNORMAL LOW (ref 12.0–15.0)
LYMPHS PCT: 28 %
Lymphs Abs: 2.4 10*3/uL (ref 0.7–4.0)
MCH: 24.3 pg — ABNORMAL LOW (ref 26.0–34.0)
MCHC: 33.2 g/dL (ref 30.0–36.0)
MCV: 73.2 fL — AB (ref 78.0–100.0)
MONO ABS: 0.3 10*3/uL (ref 0.1–1.0)
Monocytes Relative: 3 %
NEUTROS ABS: 5.8 10*3/uL (ref 1.7–7.7)
Neutrophils Relative %: 67 %
Platelets: 254 10*3/uL (ref 150–400)
RBC: 4.44 MIL/uL (ref 3.87–5.11)
RDW: 15.1 % (ref 11.5–15.5)
WBC: 8.7 10*3/uL (ref 4.0–10.5)

## 2016-10-25 LAB — COMPREHENSIVE METABOLIC PANEL
ALK PHOS: 148 U/L — AB (ref 38–126)
ALT: 11 U/L — AB (ref 14–54)
AST: 16 U/L (ref 15–41)
Albumin: 2.6 g/dL — ABNORMAL LOW (ref 3.5–5.0)
Anion gap: 8 (ref 5–15)
BILIRUBIN TOTAL: 0.2 mg/dL — AB (ref 0.3–1.2)
BUN: 7 mg/dL (ref 6–20)
CO2: 21 mmol/L — ABNORMAL LOW (ref 22–32)
CREATININE: 0.57 mg/dL (ref 0.44–1.00)
Calcium: 8.5 mg/dL — ABNORMAL LOW (ref 8.9–10.3)
Chloride: 106 mmol/L (ref 101–111)
GFR calc Af Amer: >60 mL/min (ref >60)
Glucose, Bld: 115 mg/dL — ABNORMAL HIGH (ref 65–99)
Potassium: 3.6 mmol/L (ref 3.5–5.1)
Sodium: 135 mmol/L (ref 135–145)
TOTAL PROTEIN: 5.9 g/dL — AB (ref 6.5–8.1)

## 2016-10-25 LAB — LIPASE, BLOOD: Lipase: 31 U/L (ref 11–51)

## 2016-10-25 MED ORDER — OXYCODONE-ACETAMINOPHEN 5-325 MG PO TABS
2.0000 | ORAL_TABLET | Freq: Once | ORAL | Status: AC
  Administered 2016-10-25: 2 via ORAL
  Filled 2016-10-25: qty 2

## 2016-10-25 MED ORDER — OXYCODONE-ACETAMINOPHEN 5-325 MG PO TABS
1.0000 | ORAL_TABLET | Freq: Four times a day (QID) | ORAL | 0 refills | Status: AC | PRN
Start: 2016-10-25 — End: ?

## 2016-10-25 MED ORDER — DOCUSATE SODIUM 100 MG PO CAPS: 100.0000 mg | ORAL_CAPSULE | Freq: Two times a day (BID) | ORAL | 2 refills | Status: DC

## 2016-10-25 NOTE — MAU Note (Signed)
Pt reports she has a history of gallstones and since yesterday she has been having right upper quad and mid upper abd pain . Also reports she feels nauseated and light-headed.

## 2016-10-25 NOTE — Discharge Instructions (Signed)
Abdominal Pain During Pregnancy Belly (abdominal) pain is common during pregnancy. Most of the time, it is not a serious problem. Other times, it can be a sign that something is wrong with the pregnancy. Always tell your doctor if you have belly pain. Follow these instructions at home: Monitor your belly pain for any changes. The following actions may help you feel better:  Do not have sex (intercourse) or put anything in your vagina until you feel better.  Rest until your pain stops.  Drink clear fluids if you feel sick to your stomach (nauseous). Do not eat solid food until you feel better.  Only take medicine as told by your doctor.  Keep all doctor visits as told.  Get help right away if:  You are bleeding, leaking fluid, or pieces of tissue come out of your vagina.  You have more pain or cramping.  You keep throwing up (vomiting).  You have pain when you pee (urinate) or have blood in your pee.  You have a fever.  You do not feel your baby moving as much.  You feel very weak or feel like passing out.  You have trouble breathing, with or without belly pain.  You have a very bad headache and belly pain.  You have fluid leaking from your vagina and belly pain.  You keep having watery poop (diarrhea).  Your belly pain does not go away after resting, or the pain gets worse. This information is not intended to replace advice given to you by your health care provider. Make sure you discuss any questions you have with your health care provider. Document Released: 04/28/2009 Document Revised: 12/17/2015 Document Reviewed: 12/07/2012 Elsevier Interactive Patient Education  2018 [employer]. Cholelithiasis Cholelithiasis is also called "gallstones." It is a kind of gallbladder disease. The gallbladder is an organ that stores a liquid (bile) that helps you digest fat. Gallstones may not cause symptoms (may be silent gallstones) until they cause a blockage, and then they can  cause pain (gallbladder attack). Follow these instructions at home:  Take over-the-counter and prescription medicines only as told by your doctor.  Stay at a healthy weight.  Eat healthy foods. This includes: ? Eating fewer fatty foods, like fried foods. ? Eating fewer refined carbs (refined carbohydrates). Refined carbs are breads and grains that are highly processed, like white bread and white rice. Instead, choose whole grains like whole-wheat bread and brown rice. ? Eating more fiber. Almonds, fresh fruit, and beans are healthy sources of fiber.  Keep all follow-up visits as told by your doctor. This is important. Contact a doctor if:  You have sudden pain in the upper right side of your belly (abdomen). Pain might spread to your right shoulder or your chest. This may be a sign of a gallbladder attack.  You feel sick to your stomach (are nauseous).  You throw up (vomit).  You have been diagnosed with gallstones that have no symptoms and you get: ? Belly pain. ? Discomfort, burning, or fullness in the upper part of your belly (indigestion). Get help right away if:  You have sudden pain in the upper right side of your belly, and it lasts for more than 2 hours.  You have belly pain that lasts for more than 5 hours.  You have a fever or chills.  You keep feeling sick to your stomach or you keep throwing up.  Your skin or the whites of your eyes turn yellow (jaundice).  You have dark-colored pee (  urine).  You have light-colored poop (stool). Summary  Cholelithiasis is also called "gallstones."  The gallbladder is an organ that stores a liquid (bile) that helps you digest fat.  Silent gallstones are gallstones that do not cause symptoms.  A gallbladder attack may cause sudden pain in the upper right side of your belly. Pain might spread to your right shoulder or your chest. If this happens, contact your doctor.  If you have sudden pain in the upper right side of your  belly that lasts for more than 2 hours, get help right away. This information is not intended to replace advice given to you by your health care provider. Make sure you discuss any questions you have with your health care provider. Document Released: 10/27/2007 Document Revised: 01/25/2016 Document Reviewed: 01/25/2016 Elsevier Interactive Patient Education  2017 [employer]. Round Ligament Pain The round ligament is a cord of muscle and tissue that helps to support the uterus. It can become a source of pain during pregnancy if it becomes stretched or twisted as the baby grows. The pain usually begins in the second trimester of pregnancy, and it can come and go until the baby is delivered. It is not a serious problem, and it does not cause harm to the baby. Round ligament pain is usually a short, sharp, and pinching pain, but it can also be a dull, lingering, and aching pain. The pain is felt in the lower side of the abdomen or in the groin. It usually starts deep in the groin and moves up to the outside of the hip area. Pain can occur with:  A sudden change in position.  Rolling over in bed.  Coughing or sneezing.  Physical activity.  Follow these instructions at home: Watch your condition for any changes. Take these steps to help with your pain:  When the pain starts, relax. Then try: ? Sitting down. ? Flexing your knees up to your abdomen. ? Lying on your side with one pillow under your abdomen and another pillow between your legs. ? Sitting in a warm bath for 15-20 minutes or until the pain goes away.  Take over-the-counter and prescription medicines only as told by your health care provider.  Move slowly when you sit and stand.  Avoid long walks if they cause pain.  Stop or lessen your physical activities if they cause pain.  Contact a health care provider if:  Your pain does not go away with treatment.  You feel pain in your back that you did not have before.  Your  medicine is not helping. Get help right away if:  You develop a fever or chills.  You develop uterine contractions.  You develop vaginal bleeding.  You develop nausea or vomiting.  You develop diarrhea.  You have pain when you urinate. This information is not intended to replace advice given to you by your health care provider. Make sure you discuss any questions you have with your health care provider. Document Released: 02/17/2008 Document Revised: 10/16/2015 Document Reviewed: 07/17/2014 Elsevier Interactive Patient Education  [employer]2018 Elsevier Inc.

## 2016-10-25 NOTE — MAU Provider Note (Signed)
History   Katherine Preston is a 21 yo G1P0 pt of [facility] at [redacted]w[redacted]d who presents to maternity admission complaining of abdominal pain, dizziness, nausea, and  blurry vision.  She was seen recently in the MAU on 09/27/16  for similar problems and an US revealed cholelithiasis. The abdominal pain began yesterday after eating a meal.  The pain started in her back, moved to the right upper quadrant, and she now reports that it is generalized.  She took a tramadol yesterday which relieved the pain. However, the abdominal pain started again today after eating and remains unchanged, she has not taken anything for it.  Other  pertinent medical history includes current gestational diabetes.  She states that her fasting sugars range from 90-95 and her postprandial sugars range between 120-160, and that she has been eating a healthy diet low in carbs and fatty foods.  She also complains of a headache, swelling, and shortness of breath.She reports good fetal movement, denies vaginal itching or burning, urinary symptoms, LOF, VB, or fever.       CSN: [id number]  Arrival date and time: 10/25/16 1544   First Provider Initiated Contact with Patient 10/25/16 1629       HPI  OB History    Gravida Para Term Preterm AB Living   1 0 0 0 0 0   SAB TAB Ectopic Multiple Live Births   0 0 0 0        Past Medical History:  Diagnosis Date  . Allergic rhinitis   . Anxiety    per patient, no diagnosis  . Diabetes mellitus without complication (HCC)    diet controlled GDM  . Gall bladder pain   . Sleep apnea     Past Surgical History:  Procedure Laterality Date  . WISDOM TOOTH EXTRACTION      Family History  Problem Relation Age of Onset  . Fibroids Mother     Social History  Substance Use Topics  . Smoking status: Never Smoker  . Smokeless tobacco: Never Used  . Alcohol use 0.0 oz/week     Comment: Occassional -not with pregnancy    Allergies: No Known Allergies  Prescriptions Prior to Admission   Medication Sig Dispense Refill Last Dose  . butalbital-acetaminophen-caffeine (FIORICET, ESGIC) 50-325-40 MG tablet Take 2 tablets by mouth every 6 (six) hours as needed for headache. 45 tablet 5 Past Week at Unknown time  . calcium carbonate (TUMS - DOSED IN MG ELEMENTAL CALCIUM) 500 MG chewable tablet Chew 1 tablet by mouth 2 (two) times daily as needed for indigestion or heartburn.   Past Week at Unknown time  . cyclobenzaprine (FLEXERIL) 10 MG tablet Take 1 tablet (10 mg total) by mouth every 8 (eight) hours as needed for muscle spasms. 30 tablet 1 Past Week at Unknown time  . glyBURIDE (DIABETA) 2.5 MG tablet Take 1 tablet (2.5 mg total) by mouth 2 (two) times daily with a meal. (Patient taking differently: Take 2.5 mg by mouth 3 (three) times daily. ) 60 tablet 3 10/25/2016 at Unknown time  . ondansetron (ZOFRAN) 4 MG tablet Take 1 tablet (4 mg total) by mouth every 8 (eight) hours as needed for nausea or vomiting. 20 tablet 0 Months at Unknown time  . Prenatal Vit w/Fe-Methylfol-FA (PNV PO) Take 1 tablet by mouth daily.    10/25/2016 at Unknown time  . terconazole (TERAZOL 3) 0.8 % vaginal cream Place 1 applicator vaginally at bedtime. 20 g 0 10/24/2016 at Unknown time  Review of Systems  Constitutional: Negative for chills and fever.  Eyes:       Positive for blurry vision  Gastrointestinal: Positive for abdominal pain and nausea. Negative for constipation, diarrhea and vomiting.  Genitourinary: Negative for difficulty urinating, dysuria, vaginal bleeding, vaginal discharge and vaginal pain.  Neurological: Positive for dizziness, weakness and headaches.   Physical Exam   Results for orders placed or performed during the hospital encounter of 10/25/16 (from the past 24 hour(s))  Urinalysis, Routine w reflex microscopic     Status: Abnormal   Collection Time: 10/25/16  4:00 PM  Result Value Ref Range   Color, Urine YELLOW YELLOW   APPearance CLEAR CLEAR   Specific Gravity, Urine 1.021  1.005 - 1.030   pH 6.0 5.0 - 8.0   Glucose, UA NEGATIVE NEGATIVE mg/dL   Hgb urine dipstick NEGATIVE NEGATIVE   Bilirubin Urine NEGATIVE NEGATIVE   Ketones, ur NEGATIVE NEGATIVE mg/dL   Protein, ur NEGATIVE NEGATIVE mg/dL   Nitrite NEGATIVE NEGATIVE   Leukocytes, UA SMALL (A) NEGATIVE   RBC / HPF 0-5 0 - 5 RBC/hpf   WBC, UA 0-5 0 - 5 WBC/hpf   Bacteria, UA RARE (A) NONE SEEN   Squamous Epithelial / LPF 0-5 (A) NONE SEEN   Mucous PRESENT   CBC with Differential     Status: Abnormal   Collection Time: 10/25/16  5:06 PM  Result Value Ref Range   WBC 8.7 4.0 - 10.5 K/uL   RBC 4.44 3.87 - 5.11 MIL/uL   Hemoglobin 10.8 (L) 12.0 - 15.0 g/dL   HCT 40.9 (L) 81.1 - 91.4 %   MCV 73.2 (L) 78.0 - 100.0 fL   MCH 24.3 (L) 26.0 - 34.0 pg   MCHC 33.2 30.0 - 36.0 g/dL   RDW 78.2 95.6 - 21.3 %   Platelets 254 150 - 400 K/uL   Neutrophils Relative % 67 %   Neutro Abs 5.8 1.7 - 7.7 K/uL   Lymphocytes Relative 28 %   Lymphs Abs 2.4 0.7 - 4.0 K/uL   Monocytes Relative 3 %   Monocytes Absolute 0.3 0.1 - 1.0 K/uL   Eosinophils Relative 2 %   Eosinophils Absolute 0.2 0.0 - 0.7 K/uL   Basophils Relative 0 %   Basophils Absolute 0.0 0.0 - 0.1 K/uL  Comprehensive metabolic panel     Status: Abnormal   Collection Time: 10/25/16  5:06 PM  Result Value Ref Range   Sodium 135 135 - 145 mmol/L   Potassium 3.6 3.5 - 5.1 mmol/L   Chloride 106 101 - 111 mmol/L   CO2 21 (L) 22 - 32 mmol/L   Glucose, Bld 115 (H) 65 - 99 mg/dL   BUN 7 6 - 20 mg/dL   Creatinine, Ser 0.86 0.44 - 1.00 mg/dL   Calcium 8.5 (L) 8.9 - 10.3 mg/dL   Total Protein 5.9 (L) 6.5 - 8.1 g/dL   Albumin 2.6 (L) 3.5 - 5.0 g/dL   AST 16 15 - 41 U/L   ALT 11 (L) 14 - 54 U/L   Alkaline Phosphatase 148 (H) 38 - 126 U/L   Total Bilirubin 0.2 (L) 0.3 - 1.2 mg/dL   GFR calc non Af Amer >60 >60 mL/min   GFR calc Af Amer >60 >60 mL/min   Anion gap 8 5 - 15  Lipase, blood     Status: None   Collection Time: 10/25/16  5:06 PM  Result Value Ref  Range   Lipase 31 11 -  51 U/L   Blood pressure 98/68, pulse (!) 109, temperature 98.4 F (36.9 C), temperature source Oral, resp. rate 17, height 5\' 2"  (1.575 m), weight 118.4 kg (261 lb), last menstrual period 02/22/2016, SpO2 100 %.  Physical Exam  Constitutional: She is oriented to person, place, and time. She appears well-developed and well-nourished.  Morbidly obese  Eyes: Conjunctivae and EOM are normal. Pupils are equal, round, and reactive to light.  Cardiovascular: Normal heart sounds.  Tachycardia present.   Respiratory: Effort normal and breath sounds normal.  GI: Soft. There is tenderness.  Musculoskeletal: She exhibits edema.  Non-pitting edema bilaterally  Neurological: She is alert and oriented to person, place, and time.   SVE - 0.5/50/-3, posterior cervix .  MAU Course  Procedures  MDM UA - clean, no sign of dehydration CBC - WNL (stable anemia) CMP - WNL Lipase - WNL Orthostatics - relatively normal, slightly positive from sitting to standing due to HR (85>109) RUQ US - revealed stable cholelithiasis without ductal obstruction    Assessment and Plan  Abdominal Pain -prescribed percocet and stool softener, educated pt on the side effects of opioids that includes constipation -advised and educated pt on the importance of a healthy diet and use of a maternity belt   Jeanann LewandowskyBethany Eaton  PA Student 10/25/2016, 8:09 PM    OB FELLOW MAU DISCHARGE ATTESTATION  I have seen and examined this patient; I agree with above documentation in the student's note. Patient is coming in with persistent RUQ abdominal pain, with known cholelithiasis. No sign of cholecystitis or obstruction. Could not invoke dizziness that patient had described, educated on hydration and small meals throughout the day. Abdominal pain is consistent with round ligament pain. I personally performed a physical exam and edited the above exam, I personally performed the SVE on the patient (above). Percocet  relieved some of the patient's pain, sent home with Rx along with stool softeners.  I personally reviewed the patient's NST today, found to be REACTIVE. 125 bpm, mod var, +accels, no decels. CTX: None.  She is OK for discharge, stable. Not in labor. Reassuring fetal status.   Jen MowElizabeth Mumaw, DO OB Fellow 8:16 AM

## 2016-10-26 ENCOUNTER — Telehealth: Payer: Self-pay | Admitting: *Deleted

## 2016-10-26 ENCOUNTER — Ambulatory Visit (INDEPENDENT_AMBULATORY_CARE_PROVIDER_SITE_OTHER): Payer: BLUE CROSS/BLUE SHIELD | Admitting: Obstetrics and Gynecology

## 2016-10-26 VITALS — BP 111/78 | HR 128 | Wt 265.4 lb

## 2016-10-26 DIAGNOSIS — O0993 Supervision of high risk pregnancy, unspecified, third trimester: Secondary | ICD-10-CM | POA: Diagnosis not present

## 2016-10-26 DIAGNOSIS — O0990 Supervision of high risk pregnancy, unspecified, unspecified trimester: Secondary | ICD-10-CM

## 2016-10-26 DIAGNOSIS — R8271 Bacteriuria: Secondary | ICD-10-CM

## 2016-10-26 DIAGNOSIS — O24415 Gestational diabetes mellitus in pregnancy, controlled by oral hypoglycemic drugs: Secondary | ICD-10-CM

## 2016-10-26 NOTE — Progress Notes (Signed)
   PRENATAL VISIT NOTE  Subjective:  Rod CanYvonne Cecere is a [age] y.o. G1P0000 at 5888w2d being seen today for ongoing prenatal care.  She is currently monitored for the following issues for this high-risk pregnancy and has Obese; Supervision of high risk pregnancy, antepartum; GBS bacteriuria; Gestational diabetes mellitus (GDM) in third trimester; Positive fetal fibronectin at 22 weeks to [redacted] weeks gestation; and Premature dilation of cervix during pregnancy, antepartum on her problem list.  Patient reports no complaints.  Contractions: Not present. Vag. Bleeding: None.  Movement: Present. Denies leaking of fluid.   The following portions of the patient's history were reviewed and updated as appropriate: allergies, current medications, past family history, past medical history, past social history, past surgical history and problem list. Problem list updated.  Objective:   Vitals:   10/26/16 1004  BP: 111/78  Pulse: (!) 128  Weight: 265 lb 6.4 oz (120.4 kg)    Fetal Status: Fetal Heart Rate (bpm): NST Fundal Height: 36 cm Movement: Present     General:  Alert, oriented and cooperative. Patient is in no acute distress.  Skin: Skin is warm and dry. No rash noted.   Cardiovascular: Normal heart rate noted  Respiratory: Normal respiratory effort, no problems with respiration noted  Abdomen: Soft, gravid, appropriate for gestational age. Pain/Pressure: Absent     Pelvic:  Cervical exam deferred        Extremities: Normal range of motion.  Edema: Trace  Mental Status: Normal mood and affect. Normal behavior. Normal judgment and thought content.   Assessment and Plan:  Pregnancy: G1P0000 at 6588w2d  1. Supervision of high risk pregnancy, antepartum Patient is doing well without complaints - Fetal nonstress test  2. Gestational diabetes mellitus (GDM) in third trimester controlled on oral hypoglycemic drug Patient is taking glyburide 2.5 mg BID She is not consistently checking CBG. 2 values  from last week were within range. She did not check CBG yesterday or this morning. Reviewed the importance of monitoring CBG and bringing log book to help optimize the management of her diabetes Follow up growth ultrasound scheduled for later this month Pt informed that the ultrasound is considered a limited OB ultrasound and is not intended to be a complete ultrasound exam.  Patient also informed that the ultrasound is not being completed with the intent of assessing for fetal or placental anomalies or any pelvic abnormalities.  Explained that the purpose of today's ultrasound is to assess for  presentation and AFI which demonstrated a fetus in a cephalic presentation with AFI 11.2.  Patient acknowledges the purpose of the exam and the limitations of the study.   - Fetal nonstress test- NST reviewed and reactive with baseline 135, mod variability, + accels, no decels  3. GBS bacteriuria Will provide prophylaxis in labor  Preterm labor symptoms and general obstetric precautions including but not limited to vaginal bleeding, contractions, leaking of fluid and fetal movement were reviewed in detail with the patient. Please refer to After Visit Summary for other counseling recommendations.  No Follow-up on file.   Catalina AntiguaPeggy Sherene Plancarte, MD

## 2016-10-26 NOTE — Telephone Encounter (Signed)
Pt called to office to ask if she could perm hair at this point in pregnancy. Review with R.Denney, approved to use perm products. Pt advised to make sure she is in well ventilated area during process.

## 2016-10-29 ENCOUNTER — Inpatient Hospital Stay (HOSPITAL_COMMUNITY)
Admission: AD | Admit: 2016-10-29 | Discharge: 2016-10-29 | Disposition: A | Discharge status: Home / Self Care | Payer: BLUE CROSS/BLUE SHIELD | Source: Ambulatory Visit | Attending: Obstetrics & Gynecology | Admitting: Obstetrics & Gynecology

## 2016-10-29 ENCOUNTER — Ambulatory Visit (INDEPENDENT_AMBULATORY_CARE_PROVIDER_SITE_OTHER): Payer: BLUE CROSS/BLUE SHIELD

## 2016-10-29 ENCOUNTER — Encounter (HOSPITAL_COMMUNITY): Payer: Self-pay | Admitting: *Deleted

## 2016-10-29 VITALS — BP 107/73 | HR 109

## 2016-10-29 DIAGNOSIS — O99353 Diseases of the nervous system complicating pregnancy, third trimester: Secondary | ICD-10-CM | POA: Diagnosis not present

## 2016-10-29 DIAGNOSIS — R8271 Bacteriuria: Secondary | ICD-10-CM

## 2016-10-29 DIAGNOSIS — Z79899 Other long term (current) drug therapy: Secondary | ICD-10-CM | POA: Insufficient documentation

## 2016-10-29 DIAGNOSIS — O24113 Pre-existing diabetes mellitus, type 2, in pregnancy, third trimester: Secondary | ICD-10-CM | POA: Diagnosis not present

## 2016-10-29 DIAGNOSIS — O09893 Supervision of other high risk pregnancies, third trimester: Secondary | ICD-10-CM | POA: Diagnosis not present

## 2016-10-29 DIAGNOSIS — O24415 Gestational diabetes mellitus in pregnancy, controlled by oral hypoglycemic drugs: Secondary | ICD-10-CM | POA: Diagnosis not present

## 2016-10-29 DIAGNOSIS — O9989 Other specified diseases and conditions complicating pregnancy, childbirth and the puerperium: Secondary | ICD-10-CM

## 2016-10-29 DIAGNOSIS — R42 Dizziness and giddiness: Secondary | ICD-10-CM | POA: Diagnosis not present

## 2016-10-29 DIAGNOSIS — Z3A35 35 weeks gestation of pregnancy: Secondary | ICD-10-CM | POA: Insufficient documentation

## 2016-10-29 DIAGNOSIS — G4730 Sleep apnea, unspecified: Secondary | ICD-10-CM | POA: Insufficient documentation

## 2016-10-29 DIAGNOSIS — F419 Anxiety disorder, unspecified: Secondary | ICD-10-CM | POA: Insufficient documentation

## 2016-10-29 DIAGNOSIS — O26893 Other specified pregnancy related conditions, third trimester: Secondary | ICD-10-CM | POA: Insufficient documentation

## 2016-10-29 DIAGNOSIS — O99343 Other mental disorders complicating pregnancy, third trimester: Secondary | ICD-10-CM | POA: Insufficient documentation

## 2016-10-29 DIAGNOSIS — O0990 Supervision of high risk pregnancy, unspecified, unspecified trimester: Secondary | ICD-10-CM

## 2016-10-29 DIAGNOSIS — R112 Nausea with vomiting, unspecified: Secondary | ICD-10-CM | POA: Diagnosis present

## 2016-10-29 DIAGNOSIS — O219 Vomiting of pregnancy, unspecified: Secondary | ICD-10-CM

## 2016-10-29 LAB — URINALYSIS, ROUTINE W REFLEX MICROSCOPIC
BILIRUBIN URINE: NEGATIVE
Glucose, UA: 50 mg/dL — AB
HGB URINE DIPSTICK: NEGATIVE
Ketones, ur: NEGATIVE mg/dL
NITRITE: NEGATIVE
Protein, ur: NEGATIVE mg/dL
SPECIFIC GRAVITY, URINE: 1.015 (ref 1.005–1.030)
pH: 6 (ref 5.0–8.0)

## 2016-10-29 LAB — COMPREHENSIVE METABOLIC PANEL
ALT: 10 U/L — AB (ref 14–54)
AST: 13 U/L — ABNORMAL LOW (ref 15–41)
Albumin: 2.6 g/dL — ABNORMAL LOW (ref 3.5–5.0)
Alkaline Phosphatase: 155 U/L — ABNORMAL HIGH (ref 38–126)
Anion gap: 6 (ref 5–15)
BUN: 6 mg/dL (ref 6–20)
CALCIUM: 9.1 mg/dL (ref 8.9–10.3)
CHLORIDE: 107 mmol/L (ref 101–111)
CO2: 21 mmol/L — ABNORMAL LOW (ref 22–32)
CREATININE: 0.54 mg/dL (ref 0.44–1.00)
GFR calc Af Amer: >60 mL/min (ref >60)
Glucose, Bld: 88 mg/dL (ref 65–99)
Potassium: 4.2 mmol/L (ref 3.5–5.1)
Sodium: 134 mmol/L — ABNORMAL LOW (ref 135–145)
Total Bilirubin: <0.1 mg/dL — ABNORMAL LOW (ref 0.3–1.2)
Total Protein: 5.7 g/dL — ABNORMAL LOW (ref 6.5–8.1)

## 2016-10-29 LAB — CBC
HCT: 33.7 % — ABNORMAL LOW (ref 36.0–46.0)
Hemoglobin: 11.1 g/dL — ABNORMAL LOW (ref 12.0–15.0)
MCH: 24.1 pg — ABNORMAL LOW (ref 26.0–34.0)
MCHC: 32.9 g/dL (ref 30.0–36.0)
MCV: 73.1 fL — ABNORMAL LOW (ref 78.0–100.0)
PLATELETS: 263 10*3/uL (ref 150–400)
RBC: 4.61 MIL/uL (ref 3.87–5.11)
RDW: 15.2 % (ref 11.5–15.5)
WBC: 9.9 10*3/uL (ref 4.0–10.5)

## 2016-10-29 MED ORDER — LACTATED RINGERS IV BOLUS (SEPSIS)
1000.0000 mL | Freq: Once | INTRAVENOUS | Status: AC
  Administered 2016-10-29: 1000 mL via INTRAVENOUS

## 2016-10-29 MED ORDER — ONDANSETRON HCL 4 MG/2ML IJ SOLN
4.0000 mg | Freq: Once | INTRAMUSCULAR | Status: AC
  Administered 2016-10-29: 4 mg via INTRAVENOUS
  Filled 2016-10-29: qty 2

## 2016-10-29 NOTE — Progress Notes (Signed)
Nurse visit for NST only. Reviewed NST with CNM. NST reactive.

## 2016-10-29 NOTE — MAU Note (Signed)
+  dizziness  +Blurred vision +nausea  Symptoms started this am  States ate cereal and greek yogurt, 3 bottles of water around 930.  Had an appointment this am for NST.

## 2016-10-29 NOTE — MAU Provider Note (Signed)
Chief Complaint:  Nausea and Dizziness   First Provider Initiated Contact with Patient 10/29/16 1250      HPI: Katherine Preston is a 21 y.o. G1P0000 at [redacted]w[redacted]d who presents to maternity admissions reporting onset of dizziness and nausea this morning while standing in her bathroom to get ready. She felt like she might pass out or fall but did not because she sat down.  The dizziness and nausea persist, even when sitting, but are worse when she stands up.  She has not tried any treatments.  There are no associated symptoms. She reports good fetal movement, denies abdominal pain, LOF, vaginal bleeding, vaginal itching/burning, urinary symptoms, h/a, or fever/chills.    HPI  Past Medical History: Past Medical History:  Diagnosis Date  . Allergic rhinitis   . Anxiety    per patient, no diagnosis  . Diabetes mellitus without complication (HCC)    diet controlled GDM  . Gall bladder pain   . Sleep apnea     Past obstetric history: OB History  Gravida Para Term Preterm AB Living  1 0 0 0 0 0  SAB TAB Ectopic Multiple Live Births  0 0 0 0      # Outcome Date GA Lbr Len/2nd Weight Sex Delivery Anes PTL Lv  1 Current               Past Surgical History: Past Surgical History:  Procedure Laterality Date  . WISDOM TOOTH EXTRACTION      Family History: Family History  Problem Relation Age of Onset  . Fibroids Mother     Social History: Social History  Substance Use Topics  . Smoking status: Never Smoker  . Smokeless tobacco: Never Used  . Alcohol use 0.0 oz/week     Comment: Occassional -not with pregnancy    Allergies: No Known Allergies  Meds:  Prescriptions Prior to Admission  Medication Sig Dispense Refill Last Dose  . calcium carbonate (TUMS - DOSED IN MG ELEMENTAL CALCIUM) 500 MG chewable tablet Chew 1 tablet by mouth 2 (two) times daily as needed for indigestion or heartburn.   Past Month at Unknown time  . cyclobenzaprine (FLEXERIL) 10 MG tablet Take 1 tablet (10  mg total) by mouth every 8 (eight) hours as needed for muscle spasms. 30 tablet 1 Past Week at Unknown time  . glyBURIDE (DIABETA) 2.5 MG tablet Take 1 tablet (2.5 mg total) by mouth 2 (two) times daily with a meal. (Patient taking differently: Take 2.5 mg by mouth 3 (three) times daily. ) 60 tablet 3 10/29/2016 at Unknown time  . oxyCODONE-acetaminophen (ROXICET) 5-325 MG tablet Take 1-2 tablets by mouth every 6 (six) hours as needed for severe pain. 20 tablet 0 Past Week at Unknown time  . Prenatal Vit w/Fe-Methylfol-FA (PNV PO) Take 1 tablet by mouth daily.    10/29/2016 at Unknown time  . butalbital-acetaminophen-caffeine (FIORICET, ESGIC) 50-325-40 MG tablet Take 2 tablets by mouth every 6 (six) hours as needed for headache. (Patient not taking: Reported on 10/29/2016) 45 tablet 5 Not Taking at Unknown time  . docusate sodium (COLACE) 100 MG capsule Take 1 capsule (100 mg total) by mouth 2 (two) times daily. (Patient not taking: Reported on 10/29/2016) 60 capsule 2 Not Taking at Unknown time  . ondansetron (ZOFRAN) 4 MG tablet Take 1 tablet (4 mg total) by mouth every 8 (eight) hours as needed for nausea or vomiting. (Patient not taking: Reported on 10/29/2016) 20 tablet 0 Not Taking at Unknown time  ROS:  Review of Systems   I have reviewed patient's Past Medical Hx, Surgical Hx, Family Hx, Social Hx, medications and allergies.   Physical Exam   Patient Vitals for the past 24 hrs:  BP Temp Temp src Pulse Resp SpO2 Weight  10/29/16 1251 - - - 83 - 99 % -  10/29/16 1230 - - - 91 - 99 % -  10/29/16 1224 (!) 98/51 - - (!) 107 - 93 % -  10/29/16 1202 115/75 98 F (36.7 C) Oral (!) 115 17 100 % 267 lb 0.6 oz (121.1 kg)   Constitutional: Well-developed, well-nourished female in no acute distress.  HEART: normal rate, heart sounds, regular rhythm RESP: normal effort, lung sounds clear and equal bilaterally GI: Abd soft, non-tender, gravid appropriate for gestational age.  MS: Extremities  nontender, no edema, normal ROM Physical Examination: Neurological - alert, oriented, normal speech, no focal findings or movement disorder noted, screening mental status exam normal, cranial nerves II through XII intact, DTR's normal and symmetric, motor and sensory grossly normal bilaterally, normal muscle tone, no tremors, strength 5/5 GU: Neg CVAT.  PELVIC EXAM: Cervix pink, visually closed, without lesion, scant white creamy discharge, vaginal walls and external genitalia normal Bimanual exam: Cervix 0/long/high, firm, anterior, neg CMT, uterus nontender, nonenlarged, adnexa without tenderness, enlargement, or mass     FHT:  Baseline 135 , moderate variability, accelerations present, no decelerations Contractions: None on toco or to palpation    Labs: Results for orders placed or performed during the hospital encounter of 10/29/16 (from the past 24 hour(s))  Urinalysis, Routine w reflex microscopic     Status: Abnormal   Collection Time: 10/29/16 12:05 PM  Result Value Ref Range   Color, Urine YELLOW YELLOW   APPearance HAZY (A) CLEAR   Specific Gravity, Urine 1.015 1.005 - 1.030   pH 6.0 5.0 - 8.0   Glucose, UA 50 (A) NEGATIVE mg/dL   Hgb urine dipstick NEGATIVE NEGATIVE   Bilirubin Urine NEGATIVE NEGATIVE   Ketones, ur NEGATIVE NEGATIVE mg/dL   Protein, ur NEGATIVE NEGATIVE mg/dL   Nitrite NEGATIVE NEGATIVE   Leukocytes, UA MODERATE (A) NEGATIVE   RBC / HPF 0-5 0 - 5 RBC/hpf   WBC, UA 0-5 0 - 5 WBC/hpf   Bacteria, UA RARE (A) NONE SEEN   Squamous Epithelial / LPF 0-5 (A) NONE SEEN   Mucous PRESENT   CBC     Status: Abnormal   Collection Time: 10/29/16  1:00 PM  Result Value Ref Range   WBC 9.9 4.0 - 10.5 K/uL   RBC 4.61 3.87 - 5.11 MIL/uL   Hemoglobin 11.1 (L) 12.0 - 15.0 g/dL   HCT 16.133.7 (L) 09.636.0 - 04.546.0 %   MCV 73.1 (L) 78.0 - 100.0 fL   MCH 24.1 (L) 26.0 - 34.0 pg   MCHC 32.9 30.0 - 36.0 g/dL   RDW 40.915.2 81.111.5 - 91.415.5 %   Platelets 263 150 - 400 K/uL  Comprehensive  metabolic panel     Status: Abnormal   Collection Time: 10/29/16  1:00 PM  Result Value Ref Range   Sodium 134 (L) 135 - 145 mmol/L   Potassium 4.2 3.5 - 5.1 mmol/L   Chloride 107 101 - 111 mmol/L   CO2 21 (L) 22 - 32 mmol/L   Glucose, Bld 88 65 - 99 mg/dL   BUN 6 6 - 20 mg/dL   Creatinine, Ser 7.820.54 0.44 - 1.00 mg/dL   Calcium 9.1 8.9 - 95.610.3 mg/dL  Total Protein 5.7 (L) 6.5 - 8.1 g/dL   Albumin 2.6 (L) 3.5 - 5.0 g/dL   AST 13 (L) 15 - 41 U/L   ALT 10 (L) 14 - 54 U/L   Alkaline Phosphatase 155 (H) 38 - 126 U/L   Total Bilirubin <0.1 (L) 0.3 - 1.2 mg/dL   GFR calc non Af Amer >60 >60 mL/min   GFR calc Af Amer >60 >60 mL/min   Anion gap 6 5 - 15   B/Positive/-- (12/18 1142)  Imaging:    MAU Course/MDM: I have ordered labs and reviewed results.  NST reviewed and reactive Neuro exam wnl LR x 1000 ml and Zofran 4 mg IV given with complete resolution of symptoms.  Likely pt dizziness caused by recent lack of appetite and not eating much in last 2-3 days.  Encouraged regular meals/snacks and increased PO fluids.  F/U in office as scheduled, return to MAU as needed for emergencies.  Pt stable at time of discharge.   Assessment: 1. Dizziness   2. GBS bacteriuria   3. Supervision of high risk pregnancy, antepartum   4. Nausea and vomiting in pregnancy     Plan: Discharge home Labor precautions and fetal kick counts Follow-up Information    [facility] Follow up.   Why:  As scheduled, return to MAU as needed for emergencies Contact information: [address] [place] [address] [phone]         Allergies as of 10/29/2016   No Known Allergies     Medication List    STOP taking these medications   butalbital-acetaminophen-caffeine 50-325-40 MG tablet Commonly known as:  FIORICET, ESGIC   docusate sodium 100 MG capsule Commonly known as:  COLACE     TAKE these medications   calcium carbonate 500 MG chewable  tablet Commonly known as:  TUMS - dosed in mg elemental calcium Chew 1 tablet by mouth 2 (two) times daily as needed for indigestion or heartburn.   cyclobenzaprine 10 MG tablet Commonly known as:  FLEXERIL Take 1 tablet (10 mg total) by mouth every 8 (eight) hours as needed for muscle spasms.   glyBURIDE 2.5 MG tablet Commonly known as:  DIABETA Take 1 tablet (2.5 mg total) by mouth 2 (two) times daily with a meal. What changed:  when to take this   ondansetron 4 MG tablet Commonly known as:  ZOFRAN Take 1 tablet (4 mg total) by mouth every 8 (eight) hours as needed for nausea or vomiting.   oxyCODONE-acetaminophen 5-325 MG tablet Commonly known as:  ROXICET Take 1-2 tablets by mouth every 6 (six) hours as needed for severe pain.   PNV PO Take 1 tablet by mouth daily.       Sharen Counter Certified Nurse-Midwife 10/29/2016 3:07 PM

## 2016-11-02 ENCOUNTER — Other Ambulatory Visit: Payer: Self-pay | Admitting: Obstetrics & Gynecology

## 2016-11-02 ENCOUNTER — Ambulatory Visit (HOSPITAL_COMMUNITY): Admission: RE | Admit: 2016-11-02 | Payer: BLUE CROSS/BLUE SHIELD | Source: Ambulatory Visit

## 2016-11-02 ENCOUNTER — Ambulatory Visit (INDEPENDENT_AMBULATORY_CARE_PROVIDER_SITE_OTHER): Payer: BLUE CROSS/BLUE SHIELD | Admitting: Obstetrics & Gynecology

## 2016-11-02 ENCOUNTER — Ambulatory Visit (HOSPITAL_COMMUNITY)
Admission: RE | Admit: 2016-11-02 | Discharge: 2016-11-02 | Disposition: A | Discharge status: Home / Self Care | Payer: BLUE CROSS/BLUE SHIELD | Source: Ambulatory Visit | Attending: Obstetrics & Gynecology | Admitting: Obstetrics & Gynecology

## 2016-11-02 ENCOUNTER — Other Ambulatory Visit: Payer: BLUE CROSS/BLUE SHIELD

## 2016-11-02 ENCOUNTER — Other Ambulatory Visit (HOSPITAL_COMMUNITY)
Admission: RE | Admit: 2016-11-02 | Discharge: 2016-11-02 | Disposition: A | Discharge status: Home / Self Care | Payer: BLUE CROSS/BLUE SHIELD | Source: Ambulatory Visit | Attending: Obstetrics & Gynecology | Admitting: Obstetrics & Gynecology

## 2016-11-02 VITALS — BP 115/78 | HR 93 | Wt 262.0 lb

## 2016-11-02 DIAGNOSIS — O26879 Cervical shortening, unspecified trimester: Secondary | ICD-10-CM

## 2016-11-02 DIAGNOSIS — O24415 Gestational diabetes mellitus in pregnancy, controlled by oral hypoglycemic drugs: Secondary | ICD-10-CM | POA: Insufficient documentation

## 2016-11-02 DIAGNOSIS — O99213 Obesity complicating pregnancy, third trimester: Secondary | ICD-10-CM | POA: Insufficient documentation

## 2016-11-02 DIAGNOSIS — Z3A36 36 weeks gestation of pregnancy: Secondary | ICD-10-CM | POA: Diagnosis not present

## 2016-11-02 DIAGNOSIS — O3433 Maternal care for cervical incompetence, third trimester: Secondary | ICD-10-CM

## 2016-11-02 DIAGNOSIS — O0990 Supervision of high risk pregnancy, unspecified, unspecified trimester: Secondary | ICD-10-CM | POA: Insufficient documentation

## 2016-11-02 DIAGNOSIS — R8271 Bacteriuria: Secondary | ICD-10-CM

## 2016-11-02 DIAGNOSIS — Z113 Encounter for screening for infections with a predominantly sexual mode of transmission: Secondary | ICD-10-CM

## 2016-11-02 DIAGNOSIS — Z3A00 Weeks of gestation of pregnancy not specified: Secondary | ICD-10-CM | POA: Diagnosis not present

## 2016-11-02 DIAGNOSIS — O0993 Supervision of high risk pregnancy, unspecified, third trimester: Secondary | ICD-10-CM

## 2016-11-02 DIAGNOSIS — O3430 Maternal care for cervical incompetence, unspecified trimester: Secondary | ICD-10-CM

## 2016-11-02 NOTE — Addendum Note (Signed)
Addended by: Dalphine HandingGARDNER, Mikias Lanz L on: 11/02/2016 01:12 PM   Modules accepted: Orders

## 2016-11-02 NOTE — Progress Notes (Signed)
   PRENATAL VISIT NOTE  Subjective:  Katherine Preston is a 21 y.o. G1P0000 at 130w2d being seen today for ongoing prenatal care.  She is currently monitored for the following issues for this high-risk pregnancy and has Obese; Supervision of high risk pregnancy, antepartum; GBS bacteriuria; Gestational diabetes mellitus (GDM) in third trimester; Positive fetal fibronectin at 22 weeks to [redacted] weeks gestation; and Premature dilation of cervix during pregnancy, antepartum on her problem list.  Patient reports backache.  Contractions: Irregular. Vag. Bleeding: None.  Movement: Present. Denies leaking of fluid.   The following portions of the patient's history were reviewed and updated as appropriate: allergies, current medications, past family history, past medical history, past social history, past surgical history and problem list. Problem list updated.  Objective:   Vitals:   11/02/16 1008  BP: 115/78  Pulse: 93  Weight: 262 lb (118.8 kg)    Fetal Status: Fetal Heart Rate (bpm): NST   Movement: Present     General:  Alert, oriented and cooperative. Patient is in no acute distress.  Skin: Skin is warm and dry. No rash noted.   Cardiovascular: Normal heart rate noted  Respiratory: Normal respiratory effort, no problems with respiration noted  Abdomen: Soft, gravid, appropriate for gestational age. Pain/Pressure: Present     Pelvic:  Cervical exam performed        Extremities: Normal range of motion.  Edema: None  Mental Status: Normal mood and affect. Normal behavior. Normal judgment and thought content.   Assessment and Plan:  Pregnancy: G1P0000 at 8530w2d  1. Gestational diabetes mellitus (GDM) in third trimester controlled on oral hypoglycemic drug  - Amniotic fluid index - Fetal nonstress test - US MFM FETAL BPP W/NONSTRESS; Future  2. Supervision of high risk pregnancy, antepartum  - Urine cytology ancillary only  3. Premature dilation of cervix during pregnancy,  antepartum   4. GBS bacteriuria - Treat in labor  Preterm labor symptoms and general obstetric precautions including but not limited to vaginal bleeding, contractions, leaking of fluid and fetal movement were reviewed in detail with the patient. Please refer to After Visit Summary for other counseling recommendations.  Return in about 1 week (around 11/09/2016).   Allie BossierMyra C Xzayvier Fagin, MD

## 2016-11-02 NOTE — Progress Notes (Signed)
   PRENATAL VISIT NOTE  Subjective:  Katherine Preston is a 21 y.o. G1P0000 at 6770w2d being seen today for ongoing prenatal care.  She is currently monitored for the following issues for this high-risk pregnancy and has Obese; Supervision of high risk pregnancy, antepartum; GBS bacteriuria; Gestational diabetes mellitus (GDM) in third trimester; Positive fetal fibronectin at 22 weeks to [redacted] weeks gestation; and Premature dilation of cervix during pregnancy, antepartum on her problem list.  Patient reports no complaints.  Contractions: Irregular. Vag. Bleeding: None.  Movement: Present. Denies leaking of fluid.   The following portions of the patient's history were reviewed and updated as appropriate: allergies, current medications, past family history, past medical history, past social history, past surgical history and problem list. Problem list updated.  Objective:   Vitals:   11/02/16 1008  BP: 115/78  Pulse: 93  Weight: 262 lb (118.8 kg)    Fetal Status: Fetal Heart Rate (bpm): NST   Movement: Present     General:  Alert, oriented and cooperative. Patient is in no acute distress.  Skin: Skin is warm and dry. No rash noted.   Cardiovascular: Normal heart rate noted  Respiratory: Normal respiratory effort, no problems with respiration noted  Abdomen: Soft, gravid, appropriate for gestational age. Pain/Pressure: Present     Pelvic:  Cervical exam deferred        Extremities: Normal range of motion.  Edema: None  Mental Status: Normal mood and affect. Normal behavior. Normal judgment and thought content.   Assessment and Plan:  Pregnancy: G1P0000 at 6170w2d  1. Gestational diabetes mellitus (GDM) in third trimester controlled on oral hypoglycemic drug - Sugars ok, although on occasion she cheats on her diet and a random sugar will be in the 160s-170s. - Amniotic fluid index - Fetal nonstress test  2. Supervision of high risk pregnancy, antepartum  - Urine cytology ancillary  only  3. Premature dilation of cervix during pregnancy, antepartum   4. GBS bacteriuria -Treat in labor  Preterm labor symptoms and general obstetric precautions including but not limited to vaginal bleeding, contractions, leaking of fluid and fetal movement were reviewed in detail with the patient. Please refer to After Visit Summary for other counseling recommendations.  Return in about 1 week (around 11/09/2016).   Allie BossierMyra C Sheronda Parran, MD

## 2016-11-02 NOTE — Addendum Note (Signed)
Addended by: Natale MilchSTALLING, BRITTANY D on: 11/02/2016 12:02 PM   Modules accepted: Orders

## 2016-11-03 LAB — URINE CYTOLOGY ANCILLARY ONLY
CHLAMYDIA, DNA PROBE: NEGATIVE
Neisseria Gonorrhea: NEGATIVE

## 2016-11-05 ENCOUNTER — Ambulatory Visit (INDEPENDENT_AMBULATORY_CARE_PROVIDER_SITE_OTHER): Payer: BLUE CROSS/BLUE SHIELD

## 2016-11-05 VITALS — BP 103/69 | HR 94 | Wt 262.0 lb

## 2016-11-05 DIAGNOSIS — O24415 Gestational diabetes mellitus in pregnancy, controlled by oral hypoglycemic drugs: Secondary | ICD-10-CM

## 2016-11-05 NOTE — Progress Notes (Signed)
Nurse visit for NST only. NST-R reviewed w/ CNM.

## 2016-11-09 ENCOUNTER — Other Ambulatory Visit: Payer: BLUE CROSS/BLUE SHIELD

## 2016-11-09 ENCOUNTER — Ambulatory Visit (INDEPENDENT_AMBULATORY_CARE_PROVIDER_SITE_OTHER): Payer: BLUE CROSS/BLUE SHIELD | Admitting: Obstetrics & Gynecology

## 2016-11-09 VITALS — BP 109/73 | HR 111 | Wt 266.0 lb

## 2016-11-09 DIAGNOSIS — O0993 Supervision of high risk pregnancy, unspecified, third trimester: Secondary | ICD-10-CM

## 2016-11-09 DIAGNOSIS — O0990 Supervision of high risk pregnancy, unspecified, unspecified trimester: Secondary | ICD-10-CM

## 2016-11-09 DIAGNOSIS — O24415 Gestational diabetes mellitus in pregnancy, controlled by oral hypoglycemic drugs: Secondary | ICD-10-CM

## 2016-11-09 NOTE — Progress Notes (Signed)
NST reactive   PRENATAL VISIT NOTE  Subjective:  Katherine Preston is a 21 y.o. G1P0000 at 6465w2d being seen today for ongoing prenatal care.  She is currently monitored for the following issues for this high-risk pregnancy and has Obese; Supervision of high risk pregnancy, antepartum; GBS bacteriuria; Gestational diabetes mellitus (GDM) in third trimester; Positive fetal fibronectin at 22 weeks to [redacted] weeks gestation; and Premature dilation of cervix during pregnancy, antepartum on her problem list.  Patient reports no complaints.  Contractions: Irritability. Vag. Bleeding: None.  Movement: Present. Denies leaking of fluid.   The following portions of the patient's history were reviewed and updated as appropriate: allergies, current medications, past family history, past medical history, past social history, past surgical history and problem list. Problem list updated.  Objective:   Vitals:   11/09/16 1008  BP: 109/73  Pulse: (!) 111  Weight: 266 lb (120.7 kg)    Fetal Status: Fetal Heart Rate (bpm): AFI/NST Fundal Height: 37 cm Movement: Present  Presentation: Vertex  General:  Alert, oriented and cooperative. Patient is in no acute distress.  Skin: Skin is warm and dry. No rash noted.   Cardiovascular: Normal heart rate noted  Respiratory: Normal respiratory effort, no problems with respiration noted  Abdomen: Soft, gravid, appropriate for gestational age. Pain/Pressure: Present     Pelvic:  Cervical exam deferred        Extremities: Normal range of motion.  Edema: None  Mental Status: Normal mood and affect. Normal behavior. Normal judgment and thought content.   Assessment and Plan:  Pregnancy: G1P0000 at 6865w2d  1. Supervision of high risk pregnancy, antepartum Good fetal surveillance  2. Gestational diabetes mellitus (GDM) in third trimester controlled on oral hypoglycemic drug States BG control is unchanged. IOL 39 weeks - Fetal nonstress test; Future - Amniotic fluid  index  Term labor symptoms and general obstetric precautions including but not limited to vaginal bleeding, contractions, leaking of fluid and fetal movement were reviewed in detail with the patient. Please refer to After Visit Summary for other counseling recommendations.  Return in about 1 week (around 11/16/2016) for NST Friday.   Scheryl DarterJames Arnold, MD

## 2016-11-09 NOTE — Progress Notes (Signed)
Pt c/o possible sleep apnea.  AFI today 12.29 cm. Growth u/s scheduled tomorrow. CBG's unavailable today per pt.

## 2016-11-09 NOTE — Patient Instructions (Signed)
Labor Induction Labor induction is when steps are taken to cause a pregnant woman to begin the labor process. Most women go into labor on their own between 37 weeks and 42 weeks of the pregnancy. When this does not happen or when there is a medical need, methods may be used to induce labor. Labor induction causes a pregnant woman's uterus to contract. It also causes the cervix to soften (ripen), open (dilate), and thin out (efface). Usually, labor is not induced before 39 weeks of the pregnancy unless there is a problem with the baby or mother. Before inducing labor, your health care provider will consider a number of factors, including the following:  The medical condition of you and the baby.  How many weeks along you are.  The status of the baby's lung maturity.  The condition of the cervix.  The position of the baby. What are the reasons for labor induction? Labor may be induced for the following reasons:  The health of the baby or mother is at risk.  The pregnancy is overdue by 1 week or more.  The water breaks but labor does not start on its own.  The mother has a health condition or serious illness, such as high blood pressure, infection, placental abruption, or diabetes.  The amniotic fluid amounts are low around the baby.  The baby is distressed. Convenience or wanting the baby to be born on a certain date is not a reason for inducing labor. What methods are used for labor induction? Several methods of labor induction may be used, such as:  Prostaglandin medicine. This medicine causes the cervix to dilate and ripen. The medicine will also start contractions. It can be taken by mouth or by inserting a suppository into the vagina.  Inserting a thin tube (catheter) with a balloon on the end into the vagina to dilate the cervix. Once inserted, the balloon is expanded with water, which causes the cervix to open.  Stripping the membranes. Your health care provider separates  amniotic sac tissue from the cervix, causing the cervix to be stretched and causing the release of a hormone called progesterone. This may cause the uterus to contract. It is often done during an office visit. You will be sent home to wait for the contractions to begin. You will then come in for an induction.  Breaking the water. Your health care provider makes a hole in the amniotic sac using a small instrument. Once the amniotic sac breaks, contractions should begin. This may still take hours to see an effect.  Medicine to trigger or strengthen contractions. This medicine is given through an IV access tube inserted into a vein in your arm. All of the methods of induction, besides stripping the membranes, will be done in the hospital. Induction is done in the hospital so that you and the baby can be carefully monitored. How long does it take for labor to be induced? Some inductions can take up to 2-3 days. Depending on the cervix, it usually takes less time. It takes longer when you are induced early in the pregnancy or if this is your first pregnancy. If a mother is still pregnant and the induction has been going on for 2-3 days, either the mother will be sent home or a cesarean delivery will be needed. What are the risks associated with labor induction? Some of the risks of induction include:  Changes in fetal heart rate, such as too high, too low, or erratic.  Fetal distress.    Chance of infection for the mother and baby.  Increased chance of having a cesarean delivery.  Breaking off (abruption) of the placenta from the uterus (rare).  Uterine rupture (very rare). When induction is needed for medical reasons, the benefits of induction may outweigh the risks. What are some reasons for not inducing labor? Labor induction should not be done if:  It is shown that your baby does not tolerate labor.  You have had previous surgeries on your uterus, such as a myomectomy or the removal of  fibroids.  Your placenta lies very low in the uterus and blocks the opening of the cervix (placenta previa).  Your baby is not in a head-down position.  The umbilical cord drops down into the birth canal in front of the baby. This could cut off the baby's blood and oxygen supply.  You have had a previous cesarean delivery.  There are unusual circumstances, such as the baby being extremely premature. This information is not intended to replace advice given to you by your health care provider. Make sure you discuss any questions you have with your health care provider. Document Released: 09/29/2006 Document Revised: 10/16/2015 Document Reviewed: 12/07/2012 Elsevier Interactive Patient Education  2017 [employer]  

## 2016-11-10 ENCOUNTER — Encounter (HOSPITAL_COMMUNITY): Payer: Self-pay

## 2016-11-10 ENCOUNTER — Ambulatory Visit (HOSPITAL_COMMUNITY)
Admission: RE | Admit: 2016-11-10 | Discharge: 2016-11-10 | Disposition: A | Discharge status: Home / Self Care | Payer: BLUE CROSS/BLUE SHIELD | Source: Ambulatory Visit | Attending: Certified Nurse Midwife | Admitting: Certified Nurse Midwife

## 2016-11-10 ENCOUNTER — Other Ambulatory Visit (HOSPITAL_COMMUNITY): Payer: Self-pay | Admitting: Obstetrics and Gynecology

## 2016-11-10 DIAGNOSIS — O24415 Gestational diabetes mellitus in pregnancy, controlled by oral hypoglycemic drugs: Secondary | ICD-10-CM | POA: Diagnosis present

## 2016-11-10 DIAGNOSIS — O99213 Obesity complicating pregnancy, third trimester: Secondary | ICD-10-CM

## 2016-11-10 DIAGNOSIS — Z3A37 37 weeks gestation of pregnancy: Secondary | ICD-10-CM

## 2016-11-10 DIAGNOSIS — O26879 Cervical shortening, unspecified trimester: Secondary | ICD-10-CM

## 2016-11-10 DIAGNOSIS — O0990 Supervision of high risk pregnancy, unspecified, unspecified trimester: Secondary | ICD-10-CM

## 2016-11-10 DIAGNOSIS — R8271 Bacteriuria: Secondary | ICD-10-CM

## 2016-11-10 NOTE — Addendum Note (Signed)
Encounter addended by: Tommie Raymondester, Courtney Bellizzi T on: 11/10/2016 11:39 AM<BR>    Actions taken: Imaging Exam ended

## 2016-11-11 ENCOUNTER — Encounter (HOSPITAL_COMMUNITY): Payer: Self-pay | Admitting: *Deleted

## 2016-11-11 ENCOUNTER — Telehealth (HOSPITAL_COMMUNITY): Payer: Self-pay | Admitting: *Deleted

## 2016-11-11 NOTE — Telephone Encounter (Signed)
Preadmission screen  

## 2016-11-12 ENCOUNTER — Ambulatory Visit (INDEPENDENT_AMBULATORY_CARE_PROVIDER_SITE_OTHER): Payer: BLUE CROSS/BLUE SHIELD

## 2016-11-12 DIAGNOSIS — O0990 Supervision of high risk pregnancy, unspecified, unspecified trimester: Secondary | ICD-10-CM

## 2016-11-12 DIAGNOSIS — O24415 Gestational diabetes mellitus in pregnancy, controlled by oral hypoglycemic drugs: Secondary | ICD-10-CM

## 2016-11-12 NOTE — Progress Notes (Signed)
Pt here for NST 

## 2016-11-12 NOTE — Progress Notes (Signed)
NST:  Reactive.  FHR 150's.  15x15 accels, no decels, no UC's.  Laylana Gerwig A. Clearance CootsHarper MD 11-12-2016

## 2016-11-16 ENCOUNTER — Ambulatory Visit (INDEPENDENT_AMBULATORY_CARE_PROVIDER_SITE_OTHER): Payer: BLUE CROSS/BLUE SHIELD | Admitting: Certified Nurse Midwife

## 2016-11-16 VITALS — BP 110/75 | HR 108 | Wt 267.4 lb

## 2016-11-16 DIAGNOSIS — O24415 Gestational diabetes mellitus in pregnancy, controlled by oral hypoglycemic drugs: Secondary | ICD-10-CM

## 2016-11-16 DIAGNOSIS — O0993 Supervision of high risk pregnancy, unspecified, third trimester: Secondary | ICD-10-CM | POA: Diagnosis not present

## 2016-11-16 DIAGNOSIS — R8271 Bacteriuria: Secondary | ICD-10-CM

## 2016-11-16 DIAGNOSIS — O0990 Supervision of high risk pregnancy, unspecified, unspecified trimester: Secondary | ICD-10-CM

## 2016-11-16 MED ORDER — GLYBURIDE 2.5 MG PO TABS: 2.5000 mg | ORAL_TABLET | Freq: Every day | ORAL | 3 refills | Status: DC

## 2016-11-16 MED ORDER — GLYBURIDE 5 MG PO TABS: 5.0000 mg | ORAL_TABLET | Freq: Every day | ORAL | 0 refills | Status: DC

## 2016-11-16 NOTE — Progress Notes (Addendum)
   PRENATAL VISIT NOTE  Subjective:  Katherine Preston is a 21 y.o. G1P0000 at 6887w2d being seen today for ongoing prenatal care.  She is currently monitored for the following issues for this high-risk pregnancy and has Obese; Supervision of high risk pregnancy, antepartum; GBS bacteriuria; Gestational diabetes mellitus (GDM) in third trimester; Positive fetal fibronectin at 22 weeks to [redacted] weeks gestation; and Premature dilation of cervix during pregnancy, antepartum on her problem list.  Patient reports no complaints.  Contractions: Irregular. Vag. Bleeding: None.  Movement: Present. Denies leaking of fluid.   The following portions of the patient's history were reviewed and updated as appropriate: allergies, current medications, past family history, past medical history, past social history, past surgical history and problem list. Problem list updated.  Objective:   Vitals:   11/16/16 1359  BP: 110/75  Pulse: (!) 108  Weight: 267 lb 6.4 oz (121.3 kg)    Fetal Status: Fetal Heart Rate (bpm): NST   Movement: Present     General:  Alert, oriented and cooperative. Patient is in no acute distress.  Skin: Skin is warm and dry. No rash noted.   Cardiovascular: Normal heart rate noted  Respiratory: Normal respiratory effort, no problems with respiration noted  Abdomen: Soft, gravid, appropriate for gestational age. Pain/Pressure: Present     Pelvic:  Cervical exam deferred        Extremities: Normal range of motion.  Edema: Trace  Mental Status: Normal mood and affect. Normal behavior. Normal judgment and thought content.    Fasting's Per patient report:   95-97, 2 hour PP 121-126, @HS  130   NST: + accels, no decels, moderate variability, Cat. 1 tracing. No contractions on toco.   Assessment and Plan:  Pregnancy: G1P0000 at 6587w2d  1. Supervision of high risk pregnancy, antepartum      Reactive NST. GDM management discussed with Dr. Jolayne Pantheronstant: increase glyburide at HS to 5 mg.   2.  Gestational diabetes mellitus (GDM) in third trimester controlled on oral hypoglycemic drug     Uncontrolled GDM.   - Fetal nonstress test - glyBURIDE (DIABETA) 2.5 MG tablet; Take 1 tablet (2.5 mg total) by mouth daily with breakfast.  Dispense: 60 tablet; Refill: 3 - glyBURIDE (DIABETA) 5 MG tablet; Take 1 tablet (5 mg total) by mouth at bedtime.  Dispense: 20 tablet; Refill: 0 - US MFM FETAL BPP W/NONSTRESS; Future  3. GBS bacteriuria     PCN for labor/delivery  Term labor symptoms and general obstetric precautions including but not limited to vaginal bleeding, contractions, leaking of fluid and fetal movement were reviewed in detail with the patient. Please refer to After Visit Summary for other counseling recommendations.  Return in about 4 weeks (around 12/14/2016) for Postpartum.  BPP for Friday.  IOL scheduled 11/21/16.   Roe Coombsachelle A Kelden Lavallee, CNM

## 2016-11-19 ENCOUNTER — Other Ambulatory Visit: Payer: Self-pay | Admitting: Certified Nurse Midwife

## 2016-11-19 ENCOUNTER — Ambulatory Visit (HOSPITAL_COMMUNITY)
Admission: RE | Admit: 2016-11-19 | Discharge: 2016-11-19 | Disposition: A | Discharge status: Home / Self Care | Payer: BLUE CROSS/BLUE SHIELD | Source: Ambulatory Visit | Attending: Certified Nurse Midwife | Admitting: Certified Nurse Midwife

## 2016-11-19 DIAGNOSIS — O99210 Obesity complicating pregnancy, unspecified trimester: Secondary | ICD-10-CM | POA: Insufficient documentation

## 2016-11-19 DIAGNOSIS — Z3A38 38 weeks gestation of pregnancy: Secondary | ICD-10-CM | POA: Diagnosis not present

## 2016-11-19 DIAGNOSIS — O24415 Gestational diabetes mellitus in pregnancy, controlled by oral hypoglycemic drugs: Secondary | ICD-10-CM | POA: Diagnosis not present

## 2016-11-19 DIAGNOSIS — O26879 Cervical shortening, unspecified trimester: Secondary | ICD-10-CM | POA: Diagnosis not present

## 2016-11-21 ENCOUNTER — Inpatient Hospital Stay (HOSPITAL_COMMUNITY): Payer: BLUE CROSS/BLUE SHIELD | Admitting: Anesthesiology

## 2016-11-21 ENCOUNTER — Inpatient Hospital Stay (HOSPITAL_COMMUNITY)
Admission: RE | Admit: 2016-11-21 | Discharge: 2016-11-24 | DRG: 775 | Disposition: A | Discharge status: Home / Self Care | Payer: BLUE CROSS/BLUE SHIELD | Source: Ambulatory Visit | Attending: Obstetrics & Gynecology | Admitting: Obstetrics & Gynecology

## 2016-11-21 ENCOUNTER — Encounter (HOSPITAL_COMMUNITY): Payer: Self-pay

## 2016-11-21 DIAGNOSIS — O0990 Supervision of high risk pregnancy, unspecified, unspecified trimester: Secondary | ICD-10-CM

## 2016-11-21 DIAGNOSIS — O99824 Streptococcus B carrier state complicating childbirth: Secondary | ICD-10-CM | POA: Diagnosis present

## 2016-11-21 DIAGNOSIS — R8271 Bacteriuria: Secondary | ICD-10-CM

## 2016-11-21 DIAGNOSIS — Z6841 Body Mass Index (BMI) 40.0 and over, adult: Secondary | ICD-10-CM | POA: Diagnosis not present

## 2016-11-21 DIAGNOSIS — Z3A39 39 weeks gestation of pregnancy: Secondary | ICD-10-CM | POA: Diagnosis not present

## 2016-11-21 DIAGNOSIS — O24425 Gestational diabetes mellitus in childbirth, controlled by oral hypoglycemic drugs: Secondary | ICD-10-CM | POA: Diagnosis present

## 2016-11-21 DIAGNOSIS — O99214 Obesity complicating childbirth: Secondary | ICD-10-CM | POA: Diagnosis present

## 2016-11-21 DIAGNOSIS — O24415 Gestational diabetes mellitus in pregnancy, controlled by oral hypoglycemic drugs: Secondary | ICD-10-CM | POA: Diagnosis present

## 2016-11-21 LAB — TYPE AND SCREEN
ABO/RH(D): B POS
Antibody Screen: NEGATIVE

## 2016-11-21 LAB — CBC
HCT: 35.5 % — ABNORMAL LOW (ref 36.0–46.0)
Hemoglobin: 11.7 g/dL — ABNORMAL LOW (ref 12.0–15.0)
MCH: 23.9 pg — AB (ref 26.0–34.0)
MCHC: 33 g/dL (ref 30.0–36.0)
MCV: 72.4 fL — ABNORMAL LOW (ref 78.0–100.0)
Platelets: 285 10*3/uL (ref 150–400)
RBC: 4.9 MIL/uL (ref 3.87–5.11)
RDW: 15.8 % — AB (ref 11.5–15.5)
WBC: 9.6 10*3/uL (ref 4.0–10.5)

## 2016-11-21 LAB — GLUCOSE, CAPILLARY
Glucose-Capillary: 129 mg/dL — ABNORMAL HIGH (ref 65–99)
Glucose-Capillary: 77 mg/dL (ref 65–99)
Glucose-Capillary: 112 mg/dL — ABNORMAL HIGH (ref 65–99)
Glucose-Capillary: 81 mg/dL (ref 65–99)

## 2016-11-21 MED ORDER — EPHEDRINE 5 MG/ML INJ
10.0000 mg | INTRAVENOUS | Status: DC | PRN
  Filled 2016-11-21: qty 2

## 2016-11-21 MED ORDER — MISOPROSTOL 50MCG HALF TABLET
50.0000 ug | ORAL_TABLET | ORAL | Status: DC
  Administered 2016-11-21 (×3): 50 ug via BUCCAL
  Filled 2016-11-21 (×4): qty 1

## 2016-11-21 MED ORDER — OXYTOCIN 40 UNITS IN LACTATED RINGERS INFUSION - SIMPLE MED
2.5000 [IU]/h | INTRAVENOUS | Status: DC
  Filled 2016-11-21: qty 1000

## 2016-11-21 MED ORDER — LACTATED RINGERS IV SOLN
500.0000 mL | Freq: Once | INTRAVENOUS | Status: AC
  Administered 2016-11-21: 500 mL via INTRAVENOUS

## 2016-11-21 MED ORDER — DIPHENHYDRAMINE HCL 50 MG/ML IJ SOLN: 12.5000 mg | INTRAMUSCULAR | Status: DC | PRN

## 2016-11-21 MED ORDER — OXYCODONE-ACETAMINOPHEN 5-325 MG PO TABS: 1.0000 | ORAL_TABLET | ORAL | Status: DC | PRN

## 2016-11-21 MED ORDER — MISOPROSTOL 25 MCG QUARTER TABLET: 25.0000 ug | ORAL_TABLET | ORAL | Status: DC | PRN

## 2016-11-21 MED ORDER — LIDOCAINE HCL (PF) 1 % IJ SOLN
30.0000 mL | INTRAMUSCULAR | Status: DC | PRN
  Filled 2016-11-21: qty 30

## 2016-11-21 MED ORDER — LIDOCAINE HCL (PF) 1 % IJ SOLN
INTRAMUSCULAR | Status: DC | PRN
  Administered 2016-11-21 (×2): 5 mL

## 2016-11-21 MED ORDER — PENICILLIN G POTASSIUM 5000000 UNITS IJ SOLR
5.0000 10*6.[IU] | Freq: Once | INTRAVENOUS | Status: AC
  Administered 2016-11-21: 5 10*6.[IU] via INTRAVENOUS
  Filled 2016-11-21: qty 5

## 2016-11-21 MED ORDER — TERBUTALINE SULFATE 1 MG/ML IJ SOLN: 0.2500 mg | Freq: Once | INTRAMUSCULAR | Status: DC | PRN

## 2016-11-21 MED ORDER — FENTANYL 2.5 MCG/ML BUPIVACAINE 1/10 % EPIDURAL INFUSION (WH - ANES)
14.0000 mL/h | INTRAMUSCULAR | Status: DC | PRN
  Administered 2016-11-21 – 2016-11-22 (×2): 14 mL/h via EPIDURAL
  Filled 2016-11-21 (×2): qty 100

## 2016-11-21 MED ORDER — OXYTOCIN BOLUS FROM INFUSION
500.0000 mL | Freq: Once | INTRAVENOUS | Status: AC
  Administered 2016-11-22: 500 mL via INTRAVENOUS

## 2016-11-21 MED ORDER — LACTATED RINGERS IV SOLN
500.0000 mL | INTRAVENOUS | Status: DC | PRN
  Administered 2016-11-21 – 2016-11-22 (×2): 500 mL via INTRAVENOUS

## 2016-11-21 MED ORDER — PHENYLEPHRINE 40 MCG/ML (10ML) SYRINGE FOR IV PUSH (FOR BLOOD PRESSURE SUPPORT)
80.0000 ug | PREFILLED_SYRINGE | INTRAVENOUS | Status: DC | PRN
  Filled 2016-11-21: qty 5

## 2016-11-21 MED ORDER — LACTATED RINGERS IV SOLN
INTRAVENOUS | Status: DC
  Administered 2016-11-21 – 2016-11-22 (×4): via INTRAVENOUS

## 2016-11-21 MED ORDER — PENICILLIN G POT IN DEXTROSE 60000 UNIT/ML IV SOLN
3.0000 10*6.[IU] | INTRAVENOUS | Status: DC
  Administered 2016-11-21 – 2016-11-22 (×5): 3 10*6.[IU] via INTRAVENOUS
  Filled 2016-11-21 (×7): qty 50

## 2016-11-21 MED ORDER — ONDANSETRON HCL 4 MG/2ML IJ SOLN: 4.0000 mg | Freq: Four times a day (QID) | INTRAMUSCULAR | Status: DC | PRN

## 2016-11-21 MED ORDER — OXYCODONE-ACETAMINOPHEN 5-325 MG PO TABS: 2.0000 | ORAL_TABLET | ORAL | Status: DC | PRN

## 2016-11-21 MED ORDER — FENTANYL CITRATE (PF) 100 MCG/2ML IJ SOLN
100.0000 ug | INTRAMUSCULAR | Status: DC | PRN
  Administered 2016-11-21: 100 ug via INTRAVENOUS
  Filled 2016-11-21: qty 2

## 2016-11-21 MED ORDER — OXYTOCIN 40 UNITS IN LACTATED RINGERS INFUSION - SIMPLE MED
1.0000 m[IU]/min | INTRAVENOUS | Status: DC
  Administered 2016-11-21: 2 m[IU]/min via INTRAVENOUS
  Administered 2016-11-22: 4 m[IU]/min via INTRAVENOUS

## 2016-11-21 MED ORDER — SOD CITRATE-CITRIC ACID 500-334 MG/5ML PO SOLN: 30.0000 mL | ORAL | Status: DC | PRN

## 2016-11-21 MED ORDER — FLEET ENEMA 7-19 GM/118ML RE ENEM: 1.0000 | ENEMA | RECTAL | Status: DC | PRN

## 2016-11-21 MED ORDER — ACETAMINOPHEN 325 MG PO TABS: 650.0000 mg | ORAL_TABLET | ORAL | Status: DC | PRN

## 2016-11-21 MED ORDER — PHENYLEPHRINE 40 MCG/ML (10ML) SYRINGE FOR IV PUSH (FOR BLOOD PRESSURE SUPPORT)
80.0000 ug | PREFILLED_SYRINGE | INTRAVENOUS | Status: DC | PRN
  Filled 2016-11-21: qty 5
  Filled 2016-11-21: qty 10

## 2016-11-21 NOTE — Progress Notes (Signed)
FERN NEGATIVE BY GINGER MORRIS,RN.

## 2016-11-21 NOTE — Progress Notes (Signed)
Report given to AShofety,RN.

## 2016-11-21 NOTE — Progress Notes (Signed)
DLawson,cnm updated on sve, possible SROM. OK to perform fern in MAU. Notified of uc's more frequent. OK to hold cytotec and monitor for 45 minutes. If uc's space per policy, give buccal cytotec per order. If uc's too close per policy, start pitocin 2/2.

## 2016-11-21 NOTE — H&P (Signed)
Katherine Preston is a 21 y.o. female G1 @ 39 wks presenting for IOL for GDM.GBS pos. OB History    Gravida Para Term Preterm AB Living   1 0 0 0 0 0   SAB TAB Ectopic Multiple Live Births   0 0 0 0       Past Medical History:  Diagnosis Date  . Allergic rhinitis   . Anxiety    per patient, no diagnosis  . Diabetes mellitus without complication (HCC)    diet controlled GDM  . Gall bladder pain   . Gestational diabetes    glyburide  . Sleep apnea    Past Surgical History:  Procedure Laterality Date  . WISDOM TOOTH EXTRACTION     Family History: family history includes Fibroids in her mother. Social History:  reports that she has never smoked. She has never used smokeless tobacco. She reports that she drinks alcohol. She reports that she does not use drugs.     Maternal Diabetes: Yes:  Diabetes Type:  Insulin/Medication controlled Genetic Screening: Normal Maternal Ultrasounds/Referrals: Normal Fetal Ultrasounds or other Referrals:  None Maternal Substance Abuse:  No Significant Maternal Medications:  Meds include: Other:  glyburide Significant Maternal Lab Results:  Lab values include: Group B Strep positive Other Comments:  None  Review of Systems  Constitutional: Negative.   HENT: Negative.   Eyes: Negative.   Respiratory: Negative.   Cardiovascular: Negative.   Gastrointestinal: Negative.   Genitourinary: Negative.   Musculoskeletal: Negative.   Skin: Negative.   Neurological: Negative.   Endo/Heme/Allergies: Negative.   Psychiatric/Behavioral: Negative.    Maternal Medical History:  Reason for admission: IOL for GDM  Fetal activity: Perceived fetal activity is normal.   Last perceived fetal movement was within the past hour.    Prenatal complications: no prenatal complications Prenatal Complications - Diabetes: gestational. Diabetes is managed by oral agent (monotherapy).        Height 5\' 2"  (1.575 m), weight 267 lb (121.1 kg), last menstrual period  02/22/2016. Maternal Exam:  Uterine Assessment: None  Abdomen: Patient reports no abdominal tenderness. Fetal presentation: vertex  Introitus: Normal vulva. Normal vagina.  Ferning test: not done.  Nitrazine test: not done. Amniotic fluid character: not assessed.  Pelvis: adequate for delivery.   Cervix: Cervix evaluated by digital exam.     Fetal Exam Fetal Monitor Review: Mode: ultrasound.   Baseline rate: 135.  Variability: moderate (6-25 bpm).   Pattern: accelerations present and no decelerations.    Fetal State Assessment: Category I - tracings are normal.     Physical Exam  Constitutional: She is oriented to person, place, and time. She appears well-developed and well-nourished.  Neck: Normal range of motion.  Cardiovascular: Normal rate, regular rhythm, normal heart sounds and intact distal pulses.   Respiratory: Effort normal and breath sounds normal.  GI: Soft. Bowel sounds are normal.  Genitourinary: Vagina normal and uterus normal.  Musculoskeletal: Normal range of motion.  Neurological: She is alert and oriented to person, place, and time. She has normal reflexes.  Skin: Skin is warm and dry.  Psychiatric: She has a normal mood and affect. Her behavior is normal. Judgment and thought content normal.    Prenatal labs: ABO, Rh: B/Positive/-- (12/18 1142) Antibody: Negative (12/18 1142) Rubella: 2.39 (12/18 1142) RPR: Non Reactive (04/16 1128)  HBsAg: Negative (12/18 1142)  HIV: Non Reactive (04/16 1128)  GBS:     Assessment/Plan: VSS, SVE ft/th/post/-3, GBS pos, FHR pattern reassurring. Will start buccal cytotec.  Wyvonnia DuskyMarie Jonathan Corpus 11/21/2016, 9:13 AM

## 2016-11-21 NOTE — Progress Notes (Signed)
Katherine Preston is a 21 y.o. G1P0000 at 377w0d by ultrasound admitted for induction of labor due to Gestational diabetes.  Subjective:   Objective: BP 118/68   Pulse 100   Temp 97.7 F (36.5 C) (Oral)   Resp 18   Ht 5\' 2"  (1.575 m)   Wt 267 lb (121.1 kg)   LMP 02/22/2016   BMI 48.83 kg/m  No intake/output data recorded. No intake/output data recorded.  FHT:  FHR: 135 bpm, variability: moderate,  accelerations:  Present,  decelerations:  Absent UC:   regular, every 5-6 minutes mild intensity SVE:   Dilation: Fingertip Effacement (%): Thick Station: -2 Exam by:: DLawson,cnm  Labs: Lab Results  Component Value Date   WBC 9.6 11/21/2016   HGB 11.7 (L) 11/21/2016   HCT 35.5 (L) 11/21/2016   MCV 72.4 (L) 11/21/2016   PLT 285 11/21/2016    Assessment / Plan: Induction of labor due to gestational diabetes,  progressing well on pitocin  Labor: no yet in labor Preeclampsia:  no signs or symptoms of toxicity Fetal Wellbeing:  Category I Pain Control:  Labor support without medications I/D:  n/a Anticipated MOD:  NSVD  Katherine Preston 11/21/2016, 12:34 PM

## 2016-11-21 NOTE — Anesthesia Preprocedure Evaluation (Signed)
Anesthesia Evaluation  Patient identified by MRN, date of birth, ID band Patient awake    Reviewed: Allergy & Precautions, H&P , NPO status , Patient's Chart, lab work & pertinent test results  History of Anesthesia Complications Negative for: history of anesthetic complications  Airway Mallampati: II  TM Distance: >3 FB Neck ROM: full    Dental no notable dental hx. (+) Teeth Intact   Pulmonary sleep apnea ,    Pulmonary exam normal breath sounds clear to auscultation       Cardiovascular negative cardio ROS Normal cardiovascular exam Rhythm:regular Rate:Normal     Neuro/Psych negative neurological ROS  negative psych ROS   GI/Hepatic negative GI ROS, Neg liver ROS,   Endo/Other  diabetes, GestationalMorbid obesity  Renal/GU negative Renal ROS  negative genitourinary   Musculoskeletal   Abdominal   Peds  Hematology negative hematology ROS (+)   Anesthesia Other Findings   Reproductive/Obstetrics (+) Pregnancy                             Anesthesia Physical Anesthesia Plan  ASA: III  Anesthesia Plan: Epidural   Post-op Pain Management:    Induction:   PONV Risk Score and Plan:   Airway Management Planned:   Additional Equipment:   Intra-op Plan:   Post-operative Plan:   Informed Consent: I have reviewed the patients History and Physical, chart, labs and discussed the procedure including the risks, benefits and alternatives for the proposed anesthesia with the patient or authorized representative who has indicated his/her understanding and acceptance.     Plan Discussed with:   Anesthesia Plan Comments:         Anesthesia Quick Evaluation

## 2016-11-21 NOTE — Anesthesia Procedure Notes (Signed)
Epidural Patient location during procedure: OB  Staffing Anesthesiologist: Teaghan Melrose Performed: anesthesiologist   Preanesthetic Checklist Completed: patient identified, site marked, surgical consent, pre-op evaluation, timeout performed, IV checked, risks and benefits discussed and monitors and equipment checked  Epidural Patient position: sitting Prep: DuraPrep Patient monitoring: heart rate, continuous pulse ox and blood pressure Approach: right paramedian Location: L3-L4 Injection technique: LOR saline  Needle:  Needle type: Tuohy  Needle gauge: 17 G Needle length: 9 cm and 9 Needle insertion depth: 8 cm Catheter type: closed end flexible Catheter size: 20 Guage Catheter at skin depth: 12 cm Test dose: negative  Assessment Events: blood not aspirated, injection not painful, no injection resistance, negative IV test and no paresthesia  Additional Notes Patient identified. Risks/Benefits/Options discussed with patient including but not limited to bleeding, infection, nerve damage, paralysis, failed block, incomplete pain control, headache, blood pressure changes, nausea, vomiting, reactions to medication both or allergic, itching and postpartum back pain. Confirmed with bedside nurse the patient's most recent platelet count. Confirmed with patient that they are not currently taking any anticoagulation, have any bleeding history or any family history of bleeding disorders. Patient expressed understanding and wished to proceed. All questions were answered. Sterile technique was used throughout the entire procedure. Please see nursing notes for vital signs. Test dose was given through epidural needle and negative prior to continuing to dose epidural or start infusion. Warning signs of high block given to the patient including shortness of breath, tingling/numbness in hands, complete motor block, or any concerning symptoms with instructions to call for help. Patient was given  instructions on fall risk and not to get out of bed. All questions and concerns addressed with instructions to call with any issues.     

## 2016-11-21 NOTE — Progress Notes (Signed)
Katherine [name] is a 21 y.o. G1P0000 at 8767w0d by ultrasound admitted for induction of labor due to Gestational diabetes.  Subjective:   Objective: BP 121/66   Pulse 94   Temp 98.1 F (36.7 C) (Oral)   Resp 18   Ht 5\' 2"  (1.575 m)   Wt 267 lb (121.1 kg)   LMP 02/22/2016   BMI 48.83 kg/m  No intake/output data recorded. No intake/output data recorded.  FHT:  FHR: 145 bpm, variability: moderate,  accelerations:  Present,  decelerations:  Absent UC:   regular, every 4-6 minutes mild intensity SVE:   Dilation: 2 Effacement (%): 70 Station: -1, -2 Exam by:: Marcelino DusterMichelle, RN   Labs: Lab Results  Component Value Date   WBC 9.6 11/21/2016   HGB 11.7 (L) 11/21/2016   HCT 35.5 (L) 11/21/2016   MCV 72.4 (L) 11/21/2016   PLT 285 11/21/2016    Assessment / Plan: Induction of labor due to gestational diabetes,  progressing well on pitocin  Labor: Progressing normally Preeclampsia:  no signs or symptoms of toxicity Fetal Wellbeing:  Category I Pain Control:  Labor support without medications I/D:  n/a Anticipated MOD:  NSVD  Katherine Preston 11/21/2016, 5:07 PM

## 2016-11-21 NOTE — Anesthesia Pain Management Evaluation Note (Signed)
  CRNA Pain Management Visit Note  Patient: Rod CanYvonne Glynn, 21 y.o., female  "Hello I am a [facility] the anesthesia [facility]. We have an anesthesia team available at all times to provide care throughout the hospital, including epidural management and anesthesia for C-section. I don't know your plan for the delivery whether it a natural birth, water birth, IV sedation, nitrous supplementation, doula or epidural, but we want to meet your pain goals."   1.Was your pain managed to your expectations on prior hospitalizations?   No prior hospitalizations  2.What is your expectation for pain management during this hospitalization?     Epidural  3.How can we help you reach that goal?   Record the patient's initial score and the patient's pain goal.   Pain: 0  Pain Goal: 8 [facility] wants you to be able to say your pain was always managed very well.  Laban EmperorMalinova,Selena Swaminathan Hristova 11/21/2016

## 2016-11-21 NOTE — Progress Notes (Signed)
0940 DLawson,cnm notified of CBG 129. To hold with any further orders and recheck per q 4 hour schedule. Pt ate breakfast at 7 am.

## 2016-11-22 ENCOUNTER — Encounter (HOSPITAL_COMMUNITY): Payer: Self-pay

## 2016-11-22 DIAGNOSIS — O99824 Streptococcus B carrier state complicating childbirth: Secondary | ICD-10-CM

## 2016-11-22 DIAGNOSIS — Z3A39 39 weeks gestation of pregnancy: Secondary | ICD-10-CM

## 2016-11-22 LAB — GLUCOSE, CAPILLARY
GLUCOSE-CAPILLARY: 83 mg/dL (ref 65–99)
GLUCOSE-CAPILLARY: 89 mg/dL (ref 65–99)

## 2016-11-22 LAB — RPR: RPR Ser Ql: NONREACTIVE

## 2016-11-22 MED ORDER — SENNOSIDES-DOCUSATE SODIUM 8.6-50 MG PO TABS
2.0000 | ORAL_TABLET | ORAL | Status: DC
  Administered 2016-11-22: 2 via ORAL
  Filled 2016-11-22 (×2): qty 2

## 2016-11-22 MED ORDER — ONDANSETRON HCL 4 MG PO TABS: 4.0000 mg | ORAL_TABLET | ORAL | Status: DC | PRN

## 2016-11-22 MED ORDER — DIBUCAINE 1 % RE OINT: 1.0000 "application " | TOPICAL_OINTMENT | RECTAL | Status: DC | PRN

## 2016-11-22 MED ORDER — BENZOCAINE-MENTHOL 20-0.5 % EX AERO
1.0000 "application " | INHALATION_SPRAY | CUTANEOUS | Status: DC | PRN
  Administered 2016-11-22: 1 via TOPICAL
  Filled 2016-11-22: qty 56

## 2016-11-22 MED ORDER — COCONUT OIL OIL: 1.0000 "application " | TOPICAL_OIL | Status: DC | PRN

## 2016-11-22 MED ORDER — ONDANSETRON HCL 4 MG/2ML IJ SOLN: 4.0000 mg | INTRAMUSCULAR | Status: DC | PRN

## 2016-11-22 MED ORDER — ACETAMINOPHEN 325 MG PO TABS
650.0000 mg | ORAL_TABLET | ORAL | Status: DC | PRN
  Administered 2016-11-22: 650 mg via ORAL
  Filled 2016-11-22: qty 2

## 2016-11-22 MED ORDER — IBUPROFEN 600 MG PO TABS
600.0000 mg | ORAL_TABLET | Freq: Four times a day (QID) | ORAL | Status: DC
  Administered 2016-11-22 – 2016-11-24 (×8): 600 mg via ORAL
  Filled 2016-11-22 (×8): qty 1

## 2016-11-22 MED ORDER — PRENATAL MULTIVITAMIN CH
1.0000 | ORAL_TABLET | Freq: Every day | ORAL | Status: DC
  Administered 2016-11-22 – 2016-11-23 (×2): 1 via ORAL
  Filled 2016-11-22 (×2): qty 1

## 2016-11-22 MED ORDER — ZOLPIDEM TARTRATE 5 MG PO TABS
5.0000 mg | ORAL_TABLET | Freq: Every evening | ORAL | Status: DC | PRN
Start: 2016-11-22 — End: 2016-11-24

## 2016-11-22 MED ORDER — SIMETHICONE 80 MG PO CHEW: 80.0000 mg | CHEWABLE_TABLET | ORAL | Status: DC | PRN

## 2016-11-22 MED ORDER — DIPHENHYDRAMINE HCL 25 MG PO CAPS: 25.0000 mg | ORAL_CAPSULE | Freq: Four times a day (QID) | ORAL | Status: DC | PRN

## 2016-11-22 MED ORDER — WITCH HAZEL-GLYCERIN EX PADS: 1.0000 "application " | MEDICATED_PAD | CUTANEOUS | Status: DC | PRN

## 2016-11-22 MED ORDER — TETANUS-DIPHTH-ACELL PERTUSSIS 5-2.5-18.5 LF-MCG/0.5 IM SUSP: 0.5000 mL | Freq: Once | INTRAMUSCULAR | Status: DC

## 2016-11-22 NOTE — Progress Notes (Signed)
   Katherine [name] is a 21 y.o. G1P0000 at 1155w1d  admitted for GDMA2 on glyburide in PM.   Subjective: Patient coping well; does not feel contractions.   Objective: Vitals:   11/22/16 0001 11/22/16 0031 11/22/16 0101 11/22/16 0131  BP: 99/76 104/70 (!) 101/49 (!) 95/40  Pulse: 94 76 92 85  Resp: 16 18 16 16   Temp:  98.6 F (37 C)    TempSrc:  Oral    SpO2:      Weight:      Height:       No intake/output data recorded.  FHT:  FHR: 150 bpm, variability: moderate,  accelerations:  Present,  decelerations:  Present occasional non-repeating variable with spontaneous recovery UC:   irregular, every 2-4 minutes SVE:   Dilation: 2.5 Effacement (%): 90 Station: -2 Exam by:: Dr. Erin FullingHarraway-Smith Pitocin @ 8 mu/min  Labs: Lab Results  Component Value Date   WBC 9.6 11/21/2016   HGB 11.7 (L) 11/21/2016   HCT 35.5 (L) 11/21/2016   MCV 72.4 (L) 11/21/2016   PLT 285 11/21/2016    Assessment / Plan: Induction of labor due to gestational diabetes,  progressing well on pitocin  Labor: Progressing normally Fetal Wellbeing:  Category I Pain Control:  Epidural Anticipated MOD:  NSVD  Last two blood sugars are 81 and 89.  Foley Bulb placed at 2316.   Katherine Preston CNM 11/22/2016, 2:00 AM

## 2016-11-22 NOTE — Lactation Note (Signed)
This note was copied from a baby's chart. Lactation Consultation Note  Patient Name: Katherine Preston JYNWG'NToday's Date: 11/22/2016 Reason for consult: Initial assessment;Other (Comment);Difficult latch (IDM)  First time Mom of 8977w1d baby at 678 hrs old.  Mom with GDM, treated with glyburide.  CBGs 42, 37, 43, and 46 at 8 hrs of age.  Mom states that baby has breastfed once at 2 hrs old age, and one attempt.  Baby placed STS on Mom's chest. Baby assisted from STS on Mom's chest, to latch in football hold.  Mom has large, heavy breasts and erect nipples.  Areola compressible.  Talked to Mom about importance of baby latching deeply onto breast.  Mom repeatedly tried pulling breast away from baby's face to see the latch.   Baby opened very wide, and showed Mom how to bring baby on quickly while sandwiching breast.  Baby vigorous on the breast, Mom denies feeling any discomfort or pinching.  Identified swallowing for Mom.  Talked about alternate breast compression, and how this can increase milk transfer. Recommended continued STS, and feeding often when baby cues to feed.  If Mom has difficulty latching, to call and get assistance.  Basic breastfeeding teaching done. Brochure left with Mom.  Informed her of IP and OP Lactation services available to her.   Lactation to follow up in am.  Feeding Feeding Type: Breast Fed Length of feed: 15 min  LATCH Score/Interventions Latch: Grasps breast easily, tongue down, lips flanged, rhythmical sucking. Intervention(s): Adjust position;Assist with latch;Breast massage;Breast compression  Audible Swallowing: Spontaneous and intermittent Intervention(s): Skin to skin;Hand expression;Alternate breast massage  Type of Nipple: Everted at rest and after stimulation  Comfort (Breast/Nipple): Soft / non-tender     Hold (Positioning): Assistance needed to correctly position infant at breast and maintain latch. Intervention(s): Breastfeeding basics  reviewed;Support Pillows;Position options;Skin to skin  LATCH Score: 9  Lactation Tools Discussed/Used     Consult Status Consult Status: Follow-up Date: 11/23/16 Follow-up type: In-patient    Judee ClaraSmith, Yitzhak Awan E 11/22/2016, 2:49 PM

## 2016-11-22 NOTE — Progress Notes (Signed)
Difficulty tracing FHR. RN at bedside repositioning, adjusting and assessing from [phone].

## 2016-11-22 NOTE — Progress Notes (Signed)
   Rod CanYvonne Wolken is a 21 y.o. G1P0000 at 1464w1d  admitted for induction of labor due to Gestational diabetes.  Subjective: Patient feeling pelvic pressure; otherwise coping well  Objective: Vitals:   11/22/16 0330 11/22/16 0400 11/22/16 0430 11/22/16 0500  BP: 122/71 121/67 137/78 138/81  Pulse: 88 92 (!) 103 91  Resp: 18 16 20 18   Temp:   98.4 F (36.9 C)   TempSrc:   Oral   SpO2:      Weight:      Height:       Total I/O In: -  Out: 700 [Urine:700]  FHT:   130 bpm, will moderate variability, early decelerations UC:   regular, every 2-3 minutes SVE:   Dilation: Lip/rim Effacement (%): 90 Station: +1 Exam by:: Londell Mohhristy Goodman, RN Pitocin @ 6  mu/min  Labs: Lab Results  Component Value Date   WBC 9.6 11/21/2016   HGB 11.7 (L) 11/21/2016   HCT 35.5 (L) 11/21/2016   MCV 72.4 (L) 11/21/2016   PLT 285 11/21/2016    Assessment / Plan: Induction of labor due to gestational diabetes,  progressing well on pitocin  Labor: Progressing normally Fetal Wellbeing:  Category I Pain Control:  Epidural Anticipated MOD:  NSVD  Charlesetta GaribaldiKathryn Lorraine Kooistra CNM 11/22/2016, 5:15 AM

## 2016-11-22 NOTE — Anesthesia Postprocedure Evaluation (Signed)
Anesthesia Post Note  Patient: Rod CanYvonne Kirks  Procedure(s) Performed: * No procedures listed *     Patient location during evaluation: Mother Baby Anesthesia Type: Epidural Level of consciousness: awake and alert Pain management: pain level controlled Vital Signs Assessment: post-procedure vital signs reviewed and stable Respiratory status: spontaneous breathing Cardiovascular status: blood pressure returned to baseline Postop Assessment: no headache, patient able to bend at knees, no backache, no signs of nausea or vomiting, epidural receding and adequate PO intake Anesthetic complications: no    Last Vitals:  Vitals:   11/22/16 0921 11/22/16 1345  BP: (!) 119/58 101/70  Pulse: 79 (!) 102  Resp: 20   Temp: 36.7 C 36.9 C    Last Pain:  Vitals:   11/22/16 1345  TempSrc: Oral  PainSc:    Pain Goal: Patients Stated Pain Goal: 2 (11/22/16 1248)               Jennye MoccasinSterling, Weldon Pickingharlesetta Marie

## 2016-11-23 LAB — CBC
HEMATOCRIT: 29.9 % — AB (ref 36.0–46.0)
HEMOGLOBIN: 10.1 g/dL — AB (ref 12.0–15.0)
MCH: 24.5 pg — ABNORMAL LOW (ref 26.0–34.0)
MCHC: 33.8 g/dL (ref 30.0–36.0)
MCV: 72.6 fL — AB (ref 78.0–100.0)
Platelets: 241 10*3/uL (ref 150–400)
RBC: 4.12 MIL/uL (ref 3.87–5.11)
RDW: 15.8 % — AB (ref 11.5–15.5)
WBC: 12.1 10*3/uL — AB (ref 4.0–10.5)

## 2016-11-23 NOTE — Lactation Note (Signed)
This note was copied from a baby's chart. Lactation Consultation Note: Mother reports that she just latched infant on for a few mins and she fell asleep. Advised mother to do skin to skin with infant to rouse infant better for feedings. Mother reports that infant cluster fed last night. Mother reports that infant wanted to eat every 15 mins. Advised mother that this is normal newborn behavior. Mother to continue to cue base feed and at least 8-12 times in 24h ours. Reviewed hand expression with mother and advised to hand express before and after feedings. Mother receptive to all teaching.   Patient Name: Katherine Preston Reason for consult: Follow-up assessment   Maternal Data    Feeding Feeding Type: Breast Fed Length of feed: 25 min  LATCH Score/Interventions                      Lactation Tools Discussed/Used     Consult Status Consult Status: Follow-up Date: 11/23/16 Follow-up type: In-patient    Stevan BornKendrick, Rhyse Loux [facility] Preston, 11:45 AM

## 2016-11-23 NOTE — Progress Notes (Signed)
Post Partum Day #1 Subjective: no complaints, up ad lib, voiding and tolerating PO  Objective: Blood pressure 109/61, pulse 78, temperature 98.1 F (36.7 C), temperature source Oral, resp. rate 20, height 5\' 2"  (1.575 m), weight 267 lb (121.1 kg), last menstrual period 02/22/2016, SpO2 99 %, unknown if currently breastfeeding.  Physical Exam:  General: alert, cooperative and no distress Lochia: appropriate Uterine Fundus: firm Incision: 2nd deg perineal; healing DVT Evaluation: No evidence of DVT seen on physical exam. No cords or calf tenderness. No significant calf/ankle edema.   Recent Labs  11/21/16 0910 11/23/16 0544  HGB 11.7* 10.1*  HCT 35.5* 29.9*    Assessment/Plan: Plan for discharge tomorrow, Breastfeeding and Contraception Nexplanon   LOS: 2 days   Roe CoombsRachelle A Garrie Woodin, CNM 11/23/2016, 8:17 AM

## 2016-11-23 NOTE — Progress Notes (Signed)
MOB was referred for history of depression/anxiety. * Referral screened out by Clinical Social Worker because none of the following criteria appear to apply: ~ History of anxiety/depression during this pregnancy, or of post-partum depression. ~ Diagnosis of anxiety and/or depression within last 3 years OR * MOB's symptoms currently being treated with medication and/or therapy.  CSW provided education regarding Baby Blues vs PMADs and provided MOB with information about support groups held [facility].  CSW encouraged MOB to evaluate her mental health throughout the postpartum period with the use of the New Mom Checklist developed by Postpartum Progress and notify a medical professional if symptoms arise.    CSW also provided MOB with information to obtain a Baby Box.  MOB will make bedside nurse aware when MOB completes the online course and has confirmation code.   Please contact the Clinical Social Worker if needs arise, or if MOB requests.  Japhet Morgenthaler Boyd-Gilyard, MSW, LCSW Clinical Social Work [phone] 

## 2016-11-24 MED ORDER — MEDROXYPROGESTERONE ACETATE 150 MG/ML IM SUSP
150.0000 mg | Freq: Once | INTRAMUSCULAR | Status: DC
  Filled 2016-11-24: qty 1

## 2016-11-24 MED ORDER — IBUPROFEN 600 MG PO TABS: 600.0000 mg | ORAL_TABLET | Freq: Four times a day (QID) | ORAL | 0 refills | Status: AC

## 2016-11-24 NOTE — Discharge Instructions (Signed)

## 2016-11-24 NOTE — Discharge Summary (Signed)
OB Discharge Summary  Patient Name: Katherine Preston DOB: Feb 21, 1996 MRN: [id number]  Date of admission: 11/21/2016 Delivering MD: Marylene LandKOOISTRA, KATHRYN LORRAINE   Date of discharge: 11/24/2016  Admitting diagnosis: 39wks induction  Intrauterine pregnancy: 313w1d     Secondary diagnosis:Active Problems:   Gestational diabetes mellitus (GDM) controlled on oral hypoglycemic drug  Additional problems: morbid obesity, + GBS     Discharge diagnosis: Term Pregnancy Delivered                                                                      Augmentation: Pitocin, Cytotec and Foley Balloon  Complications: None  Hospital course:  Induction of Labor With Vaginal Delivery   21 y.o. yo G1P1001 at 633w1d was admitted to the hospital 11/21/2016 for induction of labor.  Indication for induction: A2 DM.  Patient had an uncomplicated labor course as follows: Membrane Rupture Time/Date: 4:00 PM ,11/21/2016   Intrapartum Procedures: Episiotomy: None [1]                                         Lacerations:  2nd degree [3];Perineal [11]  Patient had delivery of a Viable infant.  Information for the patient's newborn:  Peyton NajjarMazviwanza, Girl Myrene BuddyYvonne [[id number]][[id number]]  Delivery Method: Vag-Spont   11/22/2016  Details of delivery can be found in separate delivery note.  Patient had a routine postpartum course. Patient is discharged home 11/24/16.  Physical exam  Vitals:   11/22/16 1921 11/23/16 0517 11/23/16 1815 11/24/16 0533  BP: 122/64 109/61 126/77 (!) 104/59  Pulse: 77 78 98 92  Resp:  20 18 18   Temp: 98.4 F (36.9 C) 98.1 F (36.7 C) 98.7 F (37.1 C) 98.3 F (36.8 C)  TempSrc: Oral Oral Oral Oral  SpO2:  99% 100% 100%  Weight:      Height:       General: alert Lochia: appropriate Uterine Fundus: firm Incision: N/A DVT Evaluation: No evidence of DVT seen on physical exam. Labs: Lab Results  Component Value Date   WBC 12.1 (H) 11/23/2016   HGB 10.1 (L) 11/23/2016   HCT 29.9 (L) 11/23/2016   MCV 72.6 (L) 11/23/2016   PLT 241 11/23/2016   CMP Latest Ref Rng & Units 10/29/2016  Glucose 65 - 99 mg/dL 88  BUN 6 - 20 mg/dL 6  Creatinine 7.820.44 - 9.561.00 mg/dL 2.130.54  Sodium 086135 - 578145 mmol/L 134(L)  Potassium 3.5 - 5.1 mmol/L 4.2  Chloride 101 - 111 mmol/L 107  CO2 22 - 32 mmol/L 21(L)  Calcium 8.9 - 10.3 mg/dL 9.1  Total Protein 6.5 - 8.1 g/dL 4.6(N5.7(L)  Total Bilirubin 0.3 - 1.2 mg/dL <6.2(X<0.1(L)  Alkaline Phos 38 - 126 U/L 155(H)  AST 15 - 41 U/L 13(L)  ALT 14 - 54 U/L 10(L)    Discharge instruction: per After Visit Summary and "Baby and Me Booklet".  After Visit Meds:  Allergies as of 11/24/2016   No Known Allergies     Medication List    STOP taking these medications   glyBURIDE 2.5 MG tablet Commonly known as:  DIABETA   glyBURIDE 5 MG tablet Commonly  known as:  DIABETA     TAKE these medications   calcium carbonate 500 MG chewable tablet Commonly known as:  TUMS - dosed in mg elemental calcium Chew 1 tablet by mouth 4 (four) times daily as needed for indigestion or heartburn.   ibuprofen 600 MG tablet Commonly known as:  ADVIL,MOTRIN Take 1 tablet (600 mg total) by mouth every 6 (six) hours.   ondansetron 4 MG tablet Commonly known as:  ZOFRAN Take 1 tablet (4 mg total) by mouth every 8 (eight) hours as needed for nausea or vomiting.   oxyCODONE-acetaminophen 5-325 MG tablet Commonly known as:  ROXICET Take 1-2 tablets by mouth every 6 (six) hours as needed for severe pain.   PRENATAL GUMMIES/DHA & FA 0.4-32.5 MG Chew Chew 1 each by mouth daily.       Diet: carb modified diet  Activity: Advance as tolerated. Pelvic rest for 6 weeks.   Outpatient follow up:She has an appt 12-21-16. Follow up Appt:Future Appointments Date Time Provider Department Center  12/21/2016 9:30 AM Hermina Staggers, MD [facility] None   Follow up visit: No Follow-up on file.  Postpartum contraception: Depo Provera prior to discharge, plans Nexplanon at postpartum visit.  Newborn  Data: Live born female  Birth Weight: 7 lb 12 oz (3515 g) APGAR: 7, 9  Baby Feeding: Breast Disposition:home with mother   11/24/2016 Allie Bossier, MD

## 2016-11-24 NOTE — Lactation Note (Signed)
This note was copied from a baby's chart. Lactation Consultation Note  Patient Name: Katherine Preston JXBJY'NToday's Date: 11/24/2016 Reason for consult: Follow-up assessment  Mom reports that infant fed recently, but is not latching well to the R breast. Mom would like me to return to see if I can assist. Mom has my # to call for assist w/next feeding.  I discussed w/Mom the recommendation to wait until 2 weeks postpartum to receive Depo to allow her milk to come to volume fully.   Lurline HareRichey, Akul Leggette [facility] 11/24/2016, 9:42 AM

## 2016-12-21 ENCOUNTER — Ambulatory Visit: Payer: BLUE CROSS/BLUE SHIELD | Admitting: Obstetrics and Gynecology

## 2017-01-04 ENCOUNTER — Ambulatory Visit (INDEPENDENT_AMBULATORY_CARE_PROVIDER_SITE_OTHER): Payer: BLUE CROSS/BLUE SHIELD | Admitting: Obstetrics & Gynecology

## 2017-01-04 ENCOUNTER — Encounter: Payer: Self-pay | Admitting: *Deleted

## 2017-01-04 ENCOUNTER — Encounter: Payer: Self-pay | Admitting: Obstetrics & Gynecology

## 2017-01-04 DIAGNOSIS — Z30017 Encounter for initial prescription of implantable subdermal contraceptive: Secondary | ICD-10-CM

## 2017-01-04 DIAGNOSIS — Z3202 Encounter for pregnancy test, result negative: Secondary | ICD-10-CM

## 2017-01-04 DIAGNOSIS — O24429 Gestational diabetes mellitus in childbirth, unspecified control: Secondary | ICD-10-CM

## 2017-01-04 DIAGNOSIS — Z3049 Encounter for surveillance of other contraceptives: Secondary | ICD-10-CM

## 2017-01-04 LAB — POCT URINE PREGNANCY: Preg Test, Ur: NEGATIVE

## 2017-01-04 MED ORDER — ETONOGESTREL 68 MG ~~LOC~~ IMPL
68.0000 mg | DRUG_IMPLANT | Freq: Once | SUBCUTANEOUS | Status: AC
  Administered 2017-01-04: 68 mg via SUBCUTANEOUS

## 2017-01-04 NOTE — Patient Instructions (Signed)
Etonogestrel implant What is this medicine? ETONOGESTREL (et oh noe JES trel) is a contraceptive (birth control) device. It is used to prevent pregnancy. It can be used for up to 3 years. This medicine may be used for other purposes; ask your health care provider or pharmacist if you have questions. COMMON BRAND NAME(S): Implanon, Nexplanon What should I tell my health care provider before I take this medicine? They need to know if you have any of these conditions: -abnormal vaginal bleeding -blood vessel disease or blood clots -cancer of the breast, cervix, or liver -depression -diabetes -gallbladder disease -headaches -heart disease or recent heart attack -high blood pressure -high cholesterol -kidney disease -liver disease -renal disease -seizures -tobacco smoker -an unusual or allergic reaction to etonogestrel, other hormones, anesthetics or antiseptics, medicines, foods, dyes, or preservatives -pregnant or trying to get pregnant -breast-feeding How should I use this medicine? This device is inserted just under the skin on the inner side of your upper arm by a health care professional. Talk to your pediatrician regarding the use of this medicine in children. Special care may be needed. Overdosage: If you think you have taken too much of this medicine contact a poison control center or emergency room at once. NOTE: This medicine is only for you. Do not share this medicine with others. What if I miss a dose? This does not apply. What may interact with this medicine? Do not take this medicine with any of the following medications: -amprenavir -bosentan -fosamprenavir This medicine may also interact with the following medications: -barbiturate medicines for inducing sleep or treating seizures -certain medicines for fungal infections like ketoconazole and itraconazole -grapefruit juice -griseofulvin -medicines to treat seizures like carbamazepine, felbamate, oxcarbazepine,  phenytoin, topiramate -modafinil -phenylbutazone -rifampin -rufinamide -some medicines to treat HIV infection like atazanavir, indinavir, lopinavir, nelfinavir, tipranavir, ritonavir -St. John's wort This list may not describe all possible interactions. Give your health care provider a list of all the medicines, herbs, non-prescription drugs, or dietary supplements you use. Also tell them if you smoke, drink alcohol, or use illegal drugs. Some items may interact with your medicine. What should I watch for while using this medicine? This product does not protect you against HIV infection (AIDS) or other sexually transmitted diseases. You should be able to feel the implant by pressing your fingertips over the skin where it was inserted. Contact your doctor if you cannot feel the implant, and use a non-hormonal birth control method (such as condoms) until your doctor confirms that the implant is in place. If you feel that the implant may have broken or become bent while in your arm, contact your healthcare provider. What side effects may I notice from receiving this medicine? Side effects that you should report to your doctor or health care professional as soon as possible: -allergic reactions like skin rash, itching or hives, swelling of the face, lips, or tongue -breast lumps -changes in emotions or moods -depressed mood -heavy or prolonged menstrual bleeding -pain, irritation, swelling, or bruising at the insertion site -scar at site of insertion -signs of infection at the insertion site such as fever, and skin redness, pain or discharge -signs of pregnancy -signs and symptoms of a blood clot such as breathing problems; changes in vision; chest pain; severe, sudden headache; pain, swelling, warmth in the leg; trouble speaking; sudden numbness or weakness of the face, arm or leg -signs and symptoms of liver injury like dark yellow or brown urine; general ill feeling or flu-like symptoms;  light-colored   stools; loss of appetite; nausea; right upper belly pain; unusually weak or tired; yellowing of the eyes or skin -unusual vaginal bleeding, discharge -signs and symptoms of a stroke like changes in vision; confusion; trouble speaking or understanding; severe headaches; sudden numbness or weakness of the face, arm or leg; trouble walking; dizziness; loss of balance or coordination Side effects that usually do not require medical attention (report to your doctor or health care professional if they continue or are bothersome): -acne -back pain -breast pain -changes in weight -dizziness -general ill feeling or flu-like symptoms -headache -irregular menstrual bleeding -nausea -sore throat -vaginal irritation or inflammation This list may not describe all possible side effects. Call your doctor for medical advice about side effects. You may report side effects to FDA at 1-800-FDA-1088. Where should I keep my medicine? This drug is given in a hospital or clinic and will not be stored at home. NOTE: This sheet is a summary. It may not cover all possible information. If you have questions about this medicine, talk to your doctor, pharmacist, or health care provider.  2018 Elsevier/Gold Standard (2015-11-27 11:19:22)  

## 2017-01-04 NOTE — Progress Notes (Signed)
Subjective:  UPT is Negative. Administrations This Visit    etonogestrel (NEXPLANON) implant 68 mg    Admin Date 01/04/2017 Action Given Dose 68 mg Route Subdermal Administered By Maretta BeesMcGlashan, Carol J, RMA                Rod [name] is a 21 y.o. female who presents for a postpartum visit. She is 6 weeks postpartum following a spontaneous vaginal delivery. I have fully reviewed the prenatal and intrapartum course. The delivery was at 39 gestational weeks. Outcome: spontaneous vaginal delivery. Anesthesia: epidural. Postpartum course has been UNREMARKABLE. Baby's course has been UNREMARKABLE. Baby is feeding by breast. Bleeding no bleeding. Bowel function is normal. Bladder function is normal. Patient is not sexually active. Contraception method is none. Postpartum depression screening: negative.  The following portions of the patient's history were reviewed and updated as appropriate: allergies, current medications, past family history, past medical history, past social history, past surgical history and problem list.  Review of Systems Pertinent items are noted in HPI.   Objective:    BP 119/76   Pulse 81   Wt 264 lb (119.7 kg)   LMP  (LMP Unknown)   Breastfeeding? Yes   BMI 48.29 kg/m   General:  alert, cooperative and no distress   Breasts:            Abdomen:    Vulva:  not evaluated  Vagina: not evaluated                     Assessment:     normal postpartum exam. Pap smear not done at today's visit.   Plan:    1. Contraception: Nexplanon 2. Insertion today 3. Follow up in: 2 week or as needed. She needs 2 hr GTT  GYNECOLOGY OFFICE PROCEDURE NOTE  Nexplanon Insertion Procedure Patient identified, informed consent performed, consent signed.   Patient does understand that irregular bleeding is a very common side effect of this medication. She was advised to have backup contraception for one week after placement. Pregnancy test in clinic today was  negative. She has abstained since delivery. Appropriate time out taken.  Patient's left arm was prepped and draped in the usual sterile fashion. The ruler used to measure and mark insertion area.  Patient was prepped with alcohol swab and then injected with 3 ml of 1% lidocaine.  She was prepped with betadine, Nexplanon removed from packaging,  Device confirmed in needle, then inserted full length of needle and withdrawn per handbook instructions. Nexplanon was able to palpated in the patient's arm; patient palpated the insert herself. There was minimal blood loss.  Patient insertion site covered with guaze and a pressure bandage to reduce any bruising.  The patient tolerated the procedure well and was given post procedure instructions.    Katherine Preston, Katherine Mabile G, MD Attending Obstetrician & Gynecologist, [facility]

## 2017-01-25 ENCOUNTER — Ambulatory Visit: Payer: Medicaid Other | Admitting: Obstetrics & Gynecology

## 2017-01-26 ENCOUNTER — Other Ambulatory Visit: Payer: BLUE CROSS/BLUE SHIELD

## 2018-03-04 IMAGING — US US OB COMP LESS 14 WK
1 series · 15 of 28 positions shown · non-contrast
Comparison: None.

CLINICAL DATA: Patient with left lower quadrant abdominal pain and
cramping.

EXAM:
OBSTETRIC <14 WK US AND TRANSVAGINAL OB US
TECHNIQUE: Both transabdominal and transvaginal ultrasound examinations were
performed for complete evaluation of the gestation as well as the
maternal uterus, adnexal regions, and pelvic cul-de-sac.
Transvaginal technique was performed to assess early pregnancy.

[Series 1: us ob comp less 14 wk · 15 of 102 slices shown]
[im 1/102]
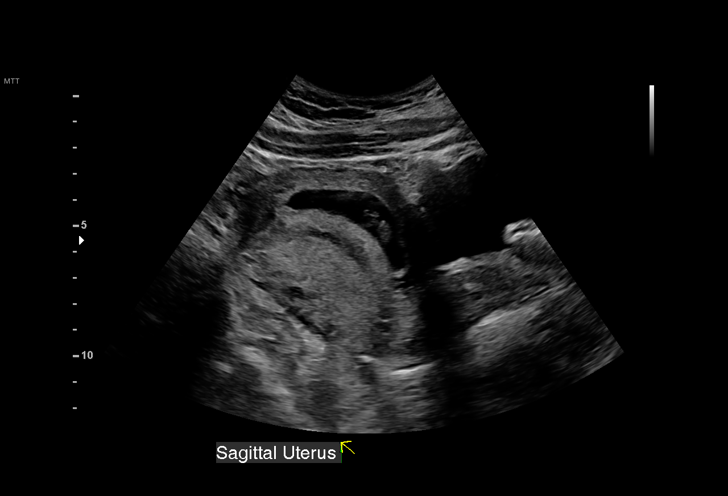
[im 8/102]
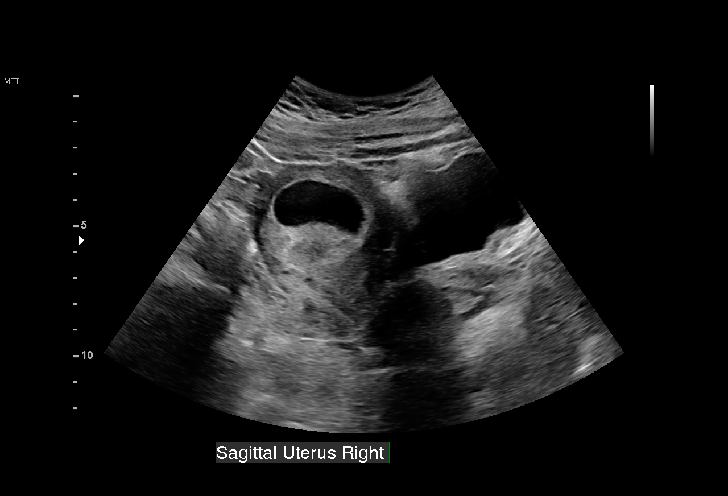
[im 15/102]
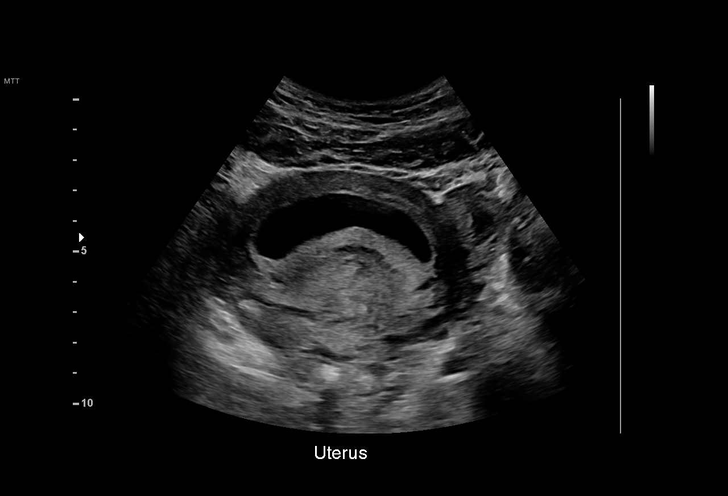
[im 23/102]
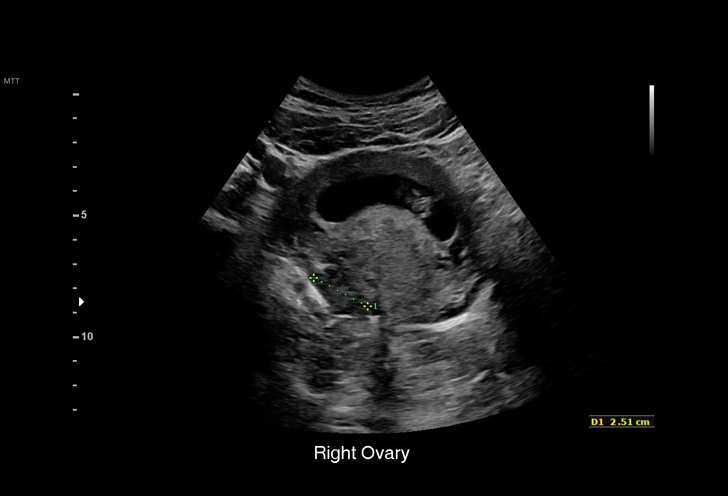
[im 30/102]
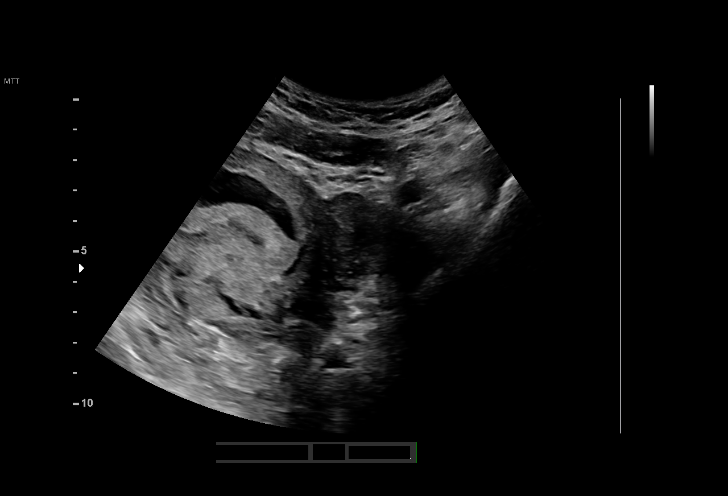
[im 38/102]
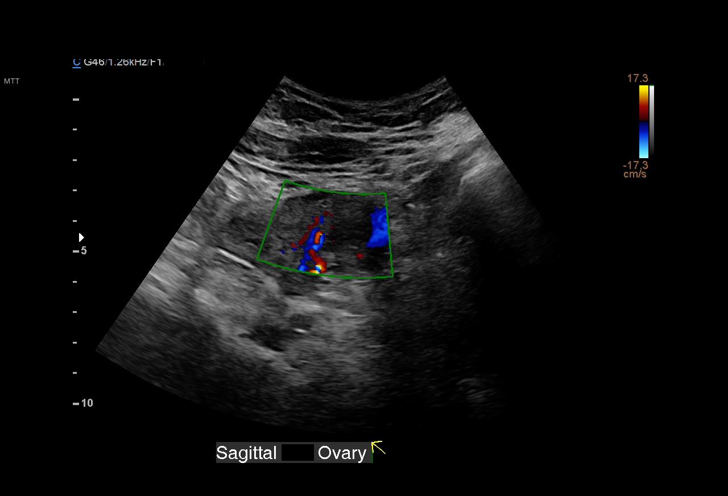
[im 45/102]
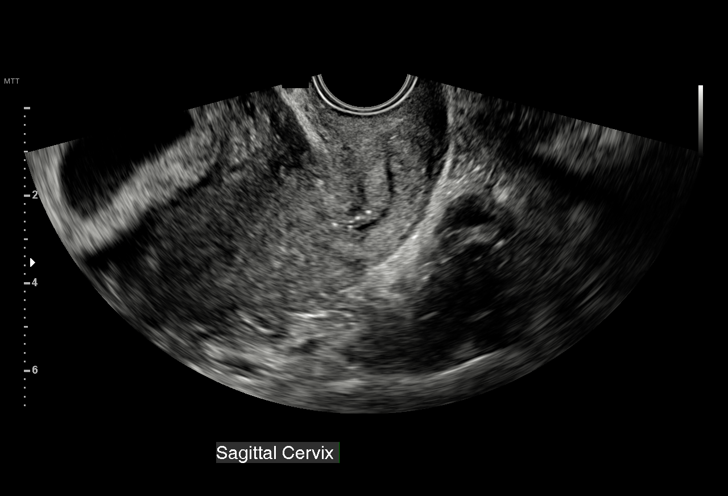
[im 53/102]
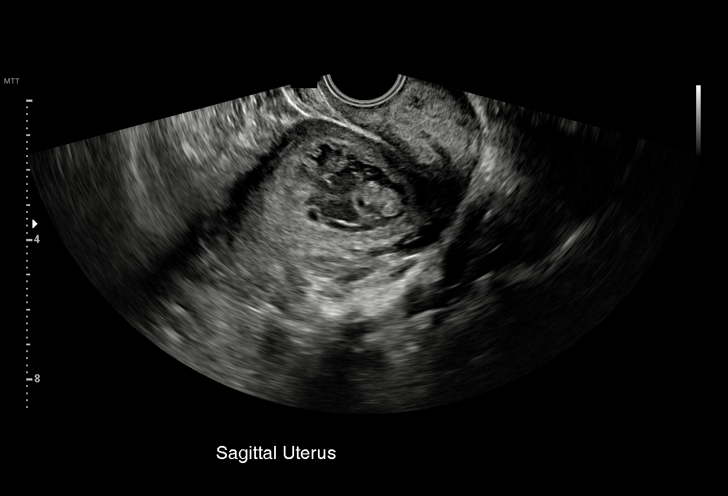
[im 57/102]
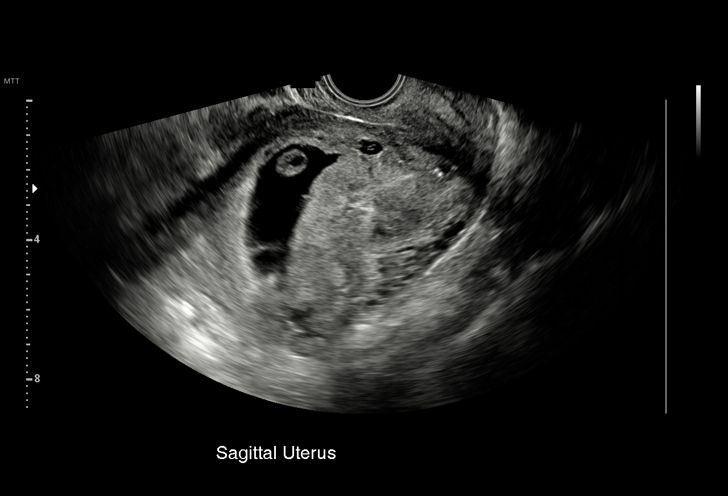
[im 64/102]
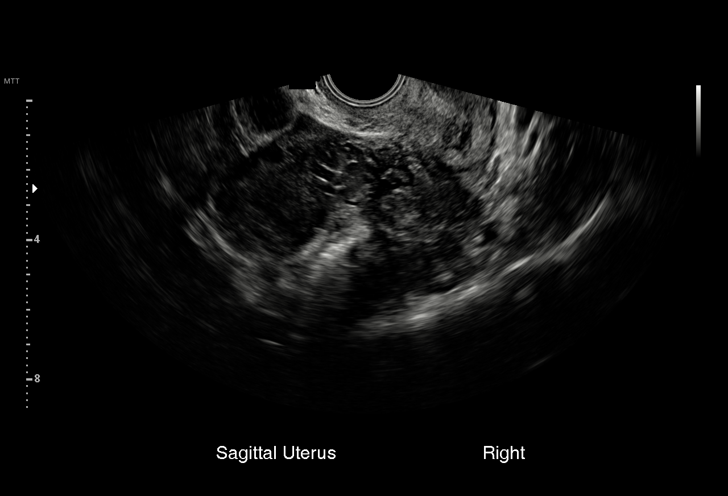
[im 72/102]
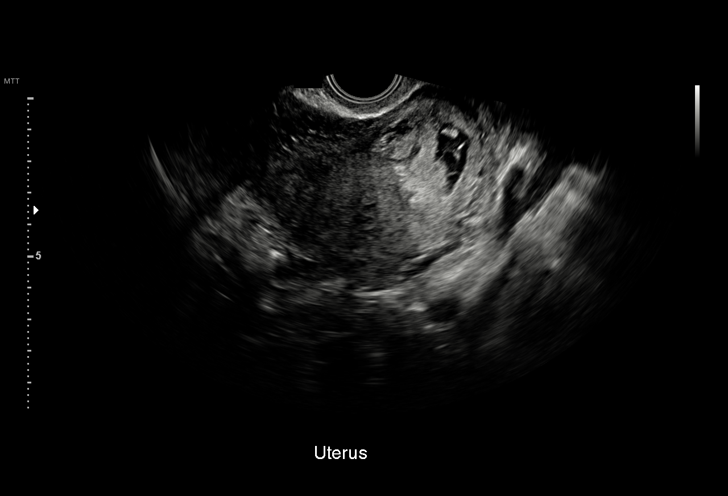
[im 79/102]
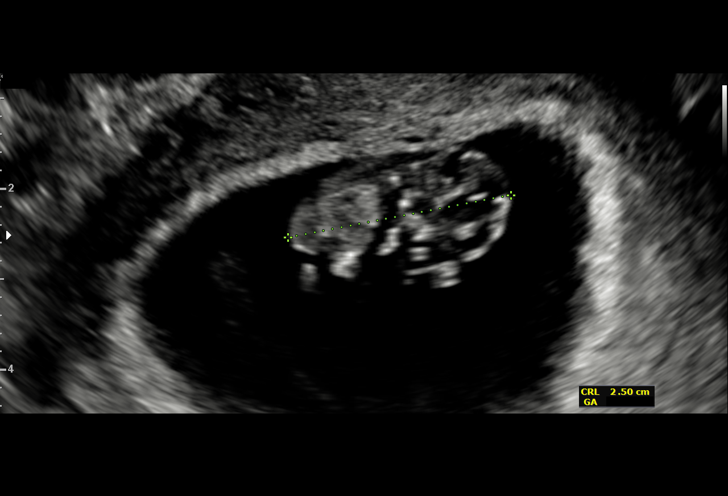
[im 87/102]
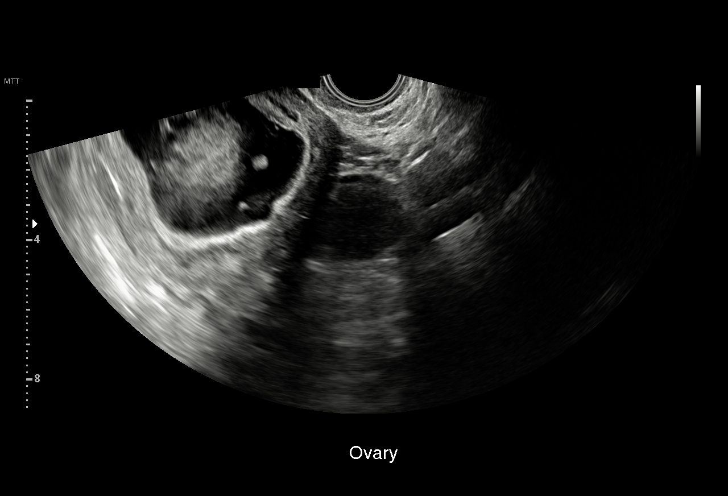
[im 94/102]
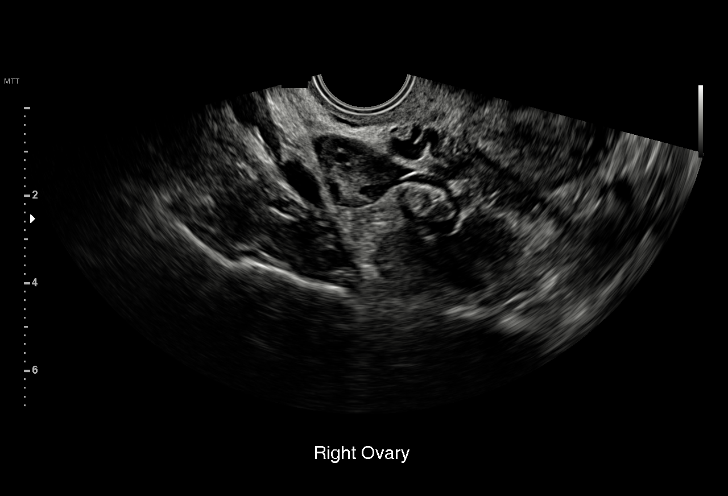
[im 102/102]
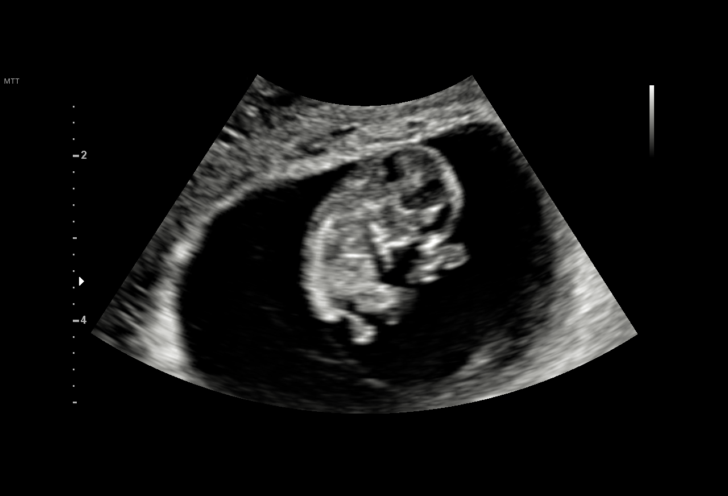

[15 of 28 positions shown; findings below may reference images not displayed]

FINDINGS: Intrauterine gestational sac: Single

Yolk sac:  Visualized.

Embryo:  Visualized.

Cardiac Activity: Visualized.

Heart Rate: 164  bpm

CRL:  25.0  mm   9 w   1 d                  US EDC: 11/24/2016

Subchorionic hemorrhage:  Moderate

Maternal uterus/adnexae: Probable corpus luteum within the left
ovary. Normal right ovary. Small amount of free fluid in the pelvis.
IMPRESSION: Single live intrauterine gestation. Moderate subchorionic
hemorrhage.

## 2018-05-12 IMAGING — US US MFM OB DETAIL+14 WK
1 series · 14 of 28 positions shown · non-contrast
Comparison: none

[Series 1: us mfm ob detail+14 wk · 86 acquisitions, 14 frames shown]
[im 4/86]
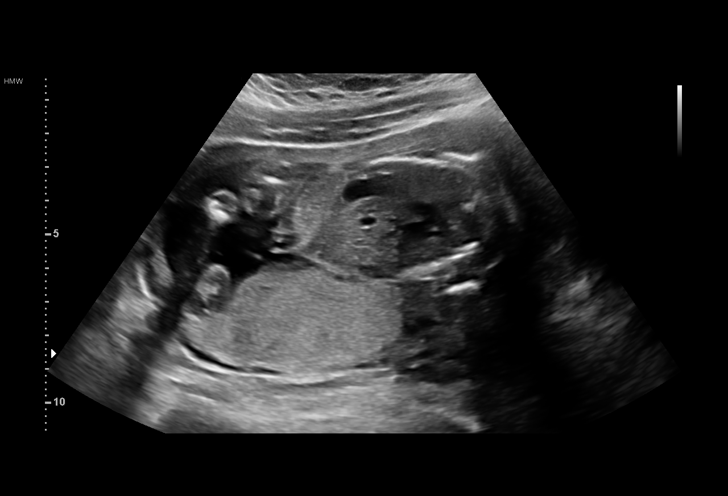
[im 10/86]
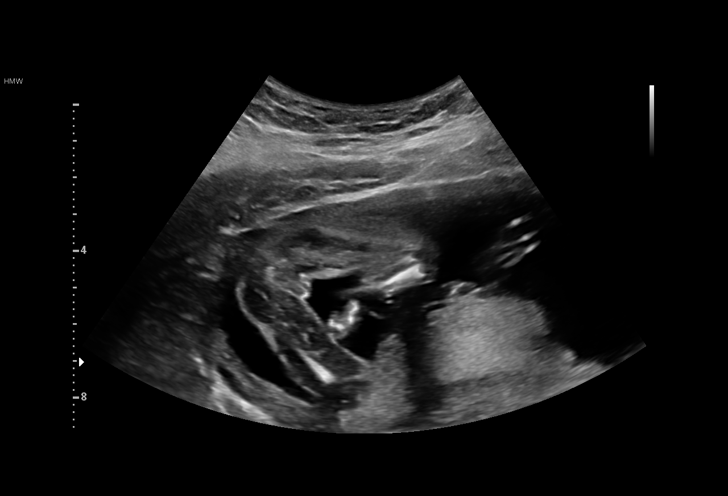
[im 16/86]
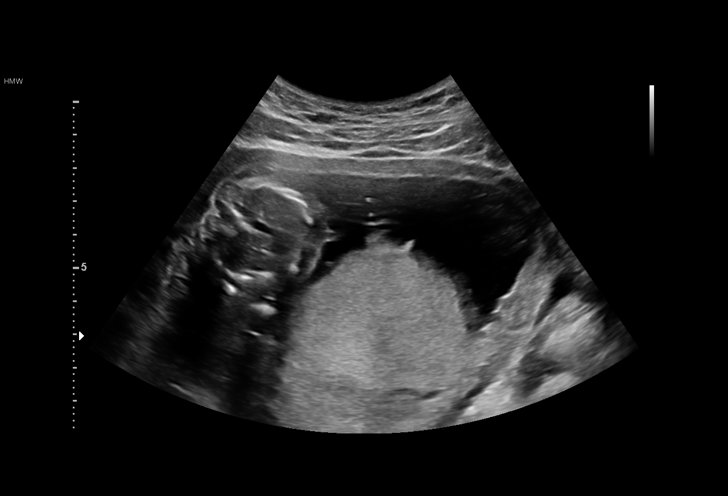
[im 23/86]
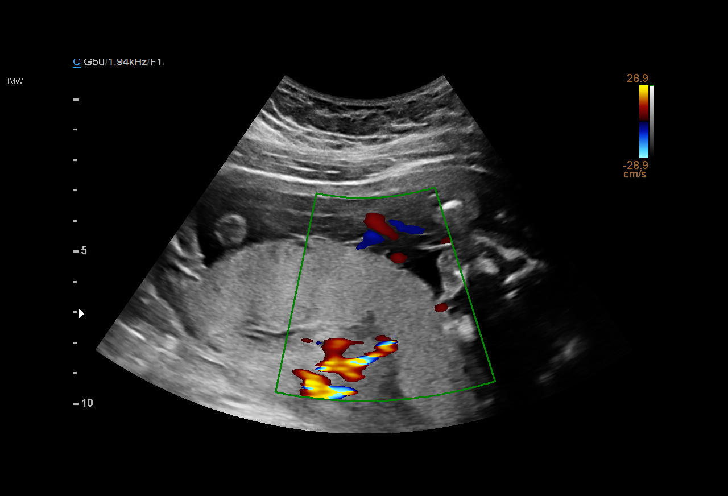
[im 29/86]
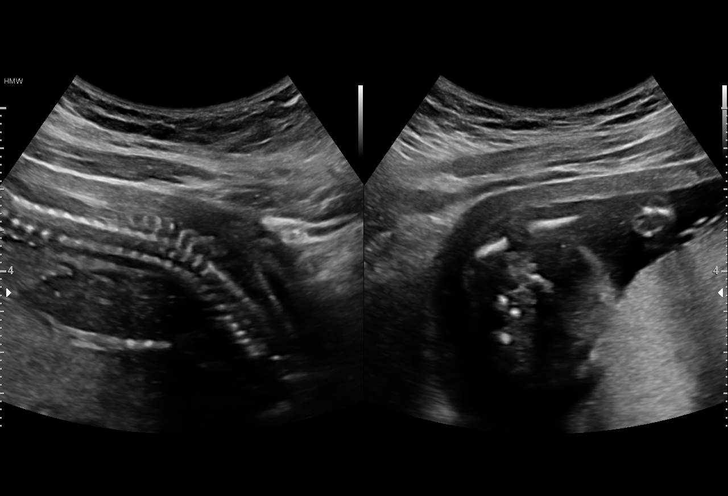
[im 35/86]
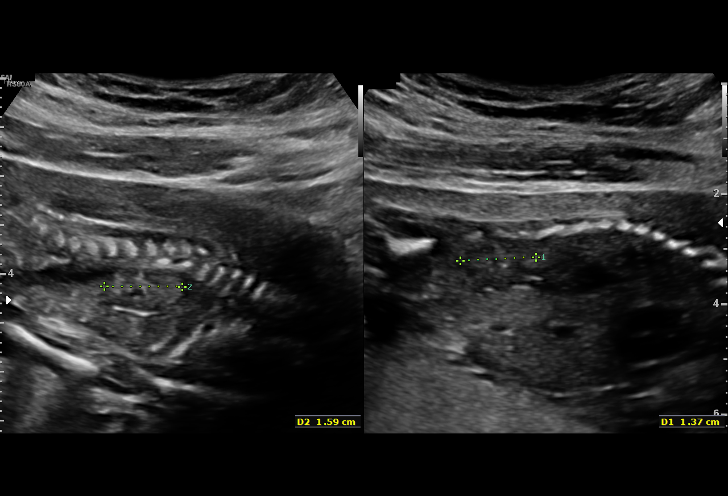
[im 41/86]
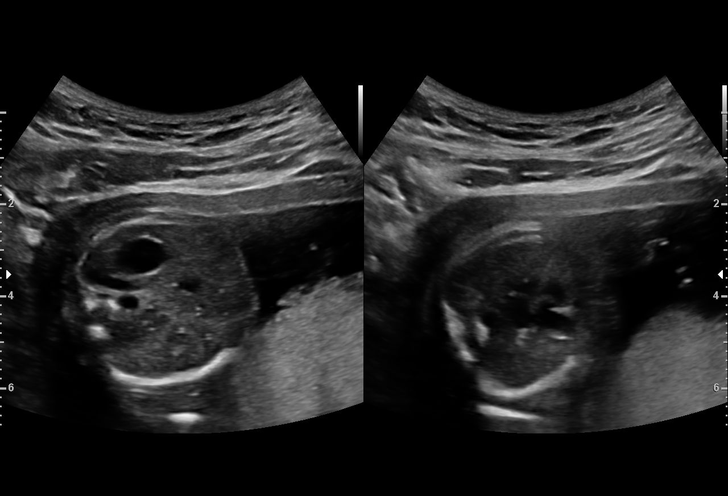
[im 48/86]
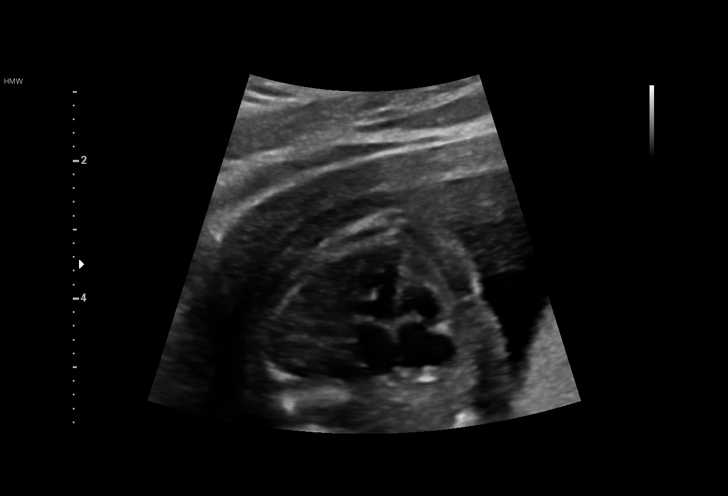
[im 54/86]
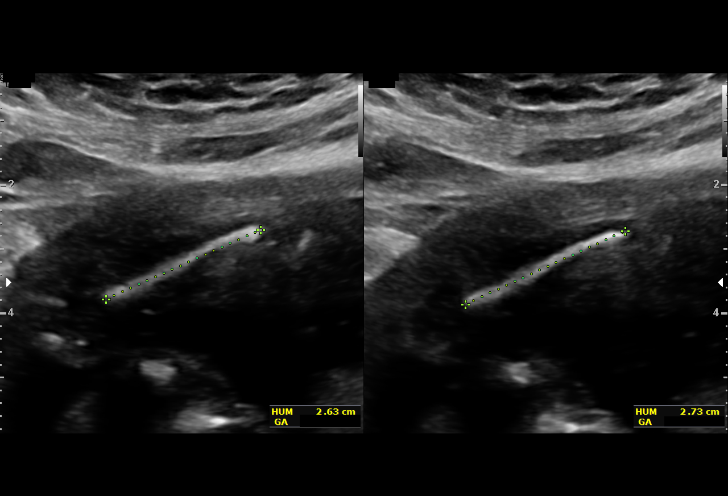
[im 60/86]
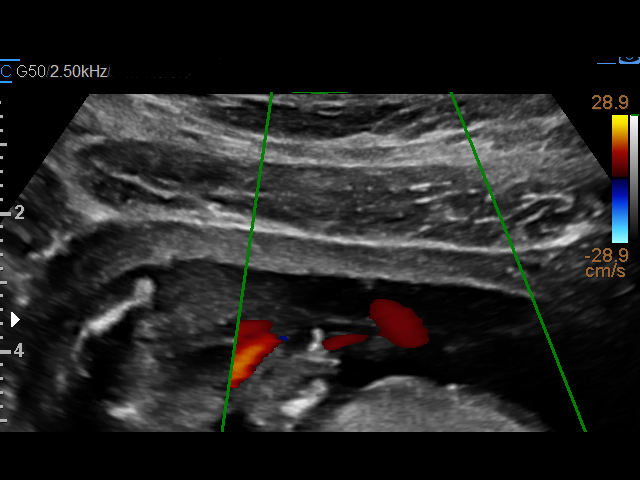
[im 67/86]
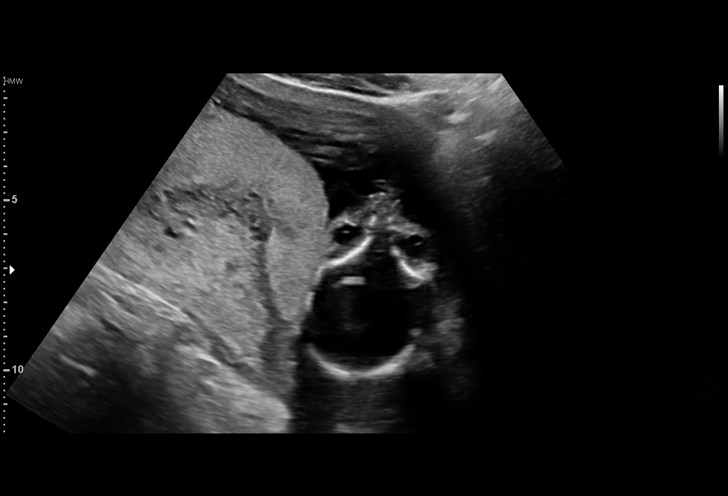
[im 73/86]
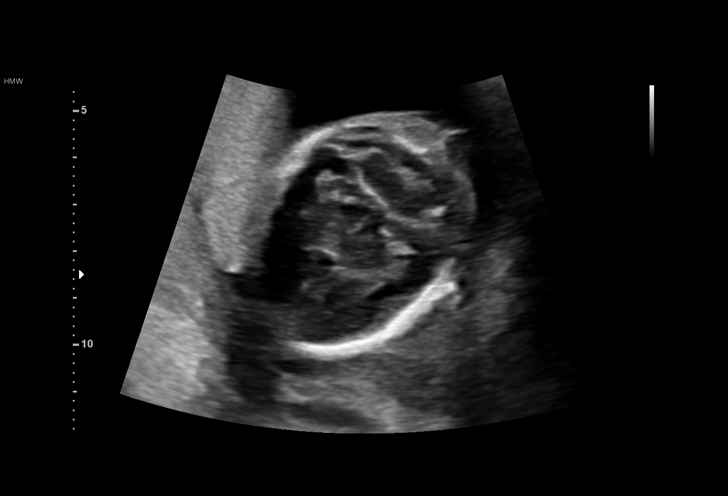
[im 79/86]
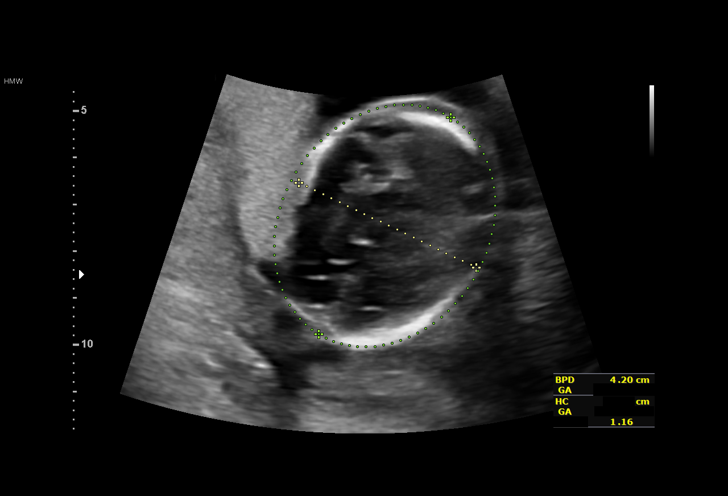
[im 86/86]
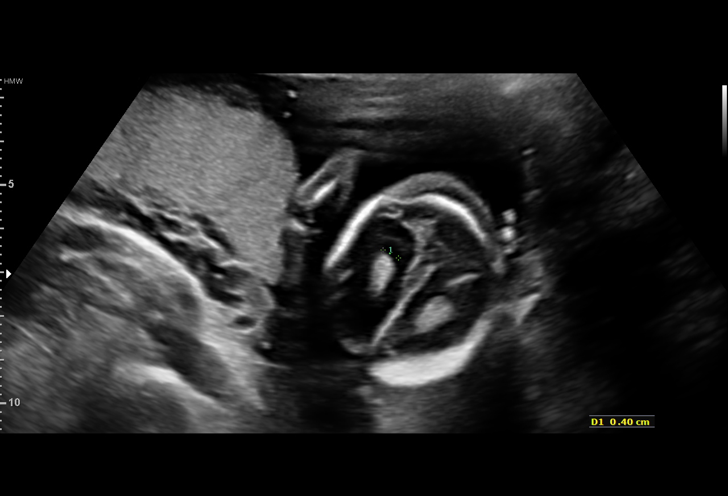

[14 of 28 positions shown; findings below may reference images not displayed]

1  LERENG LISTER            [id number]      [id number]     [id number]
Indications

18 weeks gestation of pregnancy
Encounter for antenatal screening for
malformations
Maternal morbid obesity
OB History

Blood Type:            Height:  5'2"   Weight (lb):  257      BMI:   47
Gravidity:    1
Fetal Evaluation

Num Of Fetuses:     1
Fetal Heart         136
Rate(bpm):
Cardiac Activity:   Observed
Presentation:       Cephalic
Placenta:           Posterior, above cervical os
P. Cord Insertion:  Visualized, central

Amniotic Fluid
AFI FV:      Subjectively within normal limits
Biometry

BPD:      42.3  mm     G. Age:  18w 6d         68  %    CI:        76.34   %   70 - 86
FL/HC:      18.4   %   15.8 - 18
HC:      153.4  mm     G. Age:  18w 2d         38  %    HC/AC:      1.16       1.07 -
AC:      132.5  mm     G. Age:  18w 5d         57  %    FL/BPD:     66.9   %
FL:       28.3  mm     G. Age:  18w 5d         53  %    FL/AC:      21.4   %   20 - 24
HUM:      26.8  mm     G. Age:  18w 4d         56  %

Est. FW:     252  gm      0 lb 9 oz     51  %
Gestational Age

LMP:           18w 3d       Date:   02/22/16                 EDD:   11/28/16
U/S Today:     18w 5d                                        EDD:   11/26/16
Best:          18w 3d    Det. By:   LMP  (02/22/16)          EDD:   11/28/16
Anatomy

Cranium:               Appears normal         Aortic Arch:            Appears normal
Cavum:                 Appears normal         Ductal Arch:            Not well visualized
Ventricles:            Appears normal         Diaphragm:              Appears normal
Choroid Plexus:        Appears normal         Stomach:                Appears normal, left
sided
Cerebellum:            Appears normal         Abdomen:                Appears normal
Posterior Fossa:       Appears normal         Abdominal Wall:         Appears nml (cord
insert, abd wall)
Nuchal Fold:           Not well visualized    Cord Vessels:           Appears normal (3
vessel cord)
Face:                  Appears normal         Kidneys:                Appear normal
(orbits and profile)
Lips:                  Not well visualized    Bladder:                Appears normal
Thoracic:              Appears normal         Spine:                  Appears normal
Heart:                 Appears normal         Upper Extremities:      Appears normal
(4CH, axis, and si
RVOT:                  Not well visualized    Lower Extremities:      Appears normal
LVOT:                  Not well visualized

Other:  Heels visualized. Technically difficult due to maternal habitus and
fetal position.
Cervix Uterus Adnexa

Cervix
Length:            3.1  cm.
Normal appearance by transabdominal scan.

Uterus
No abnormality visualized.

Left Ovary
No adnexal mass visualized.

Right Ovary
No adnexal mass visualized.

Cul De Sac:   No free fluid seen.

Adnexa:       No abnormality visualized.
Impression

SIUP at 18+3 weeks
Normal detailed fetal anatomy; limited views of NF, upper
lips, outflow tracts and the DA
Markers of aneuploidy: none
Normal amniotic fluid volume
Measurements consistent with LMP dating
Recommendations

Follow-up ultrasound in 4-6 weeks to complete anatomy
survey

## 2018-07-28 IMAGING — US US MFM OB FOLLOW-UP
1 series · 14 of 28 positions shown · non-contrast
Comparison: none

[Series 1: us mfm ob follow-up · 14 of 32 slices shown]
[im 2/32]
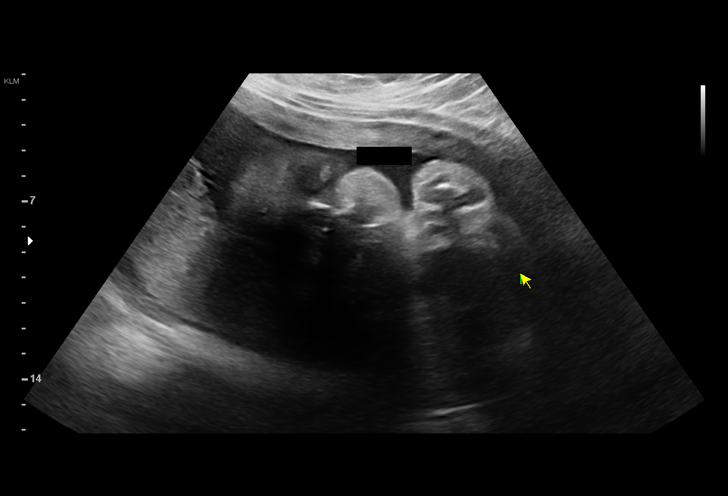
[im 4/32]
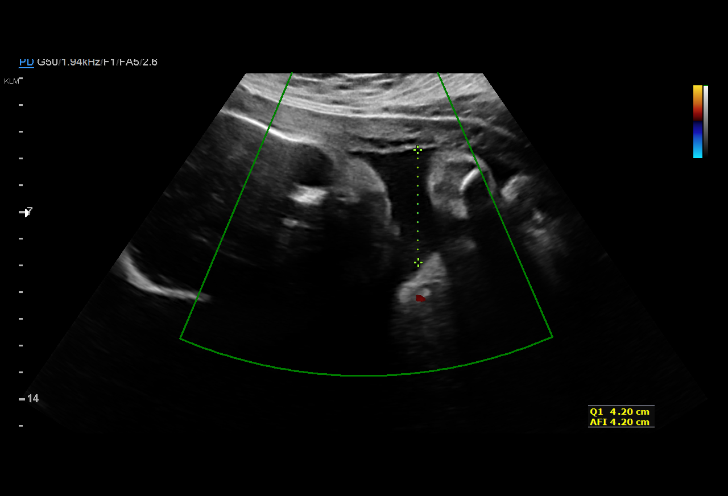
[im 6/32]
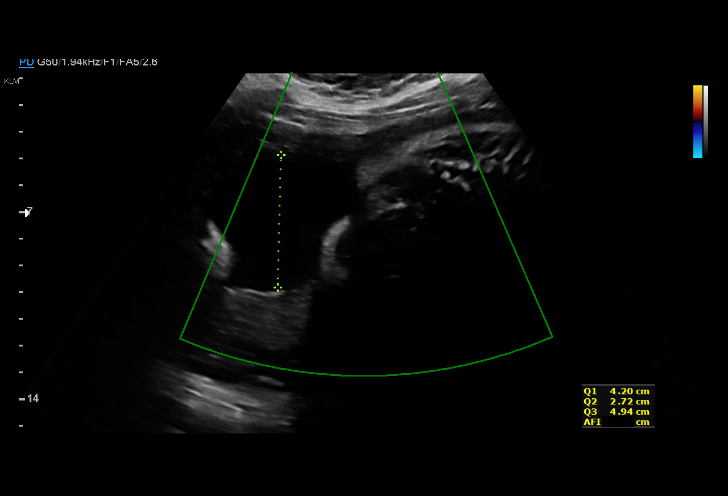
[im 9/32]
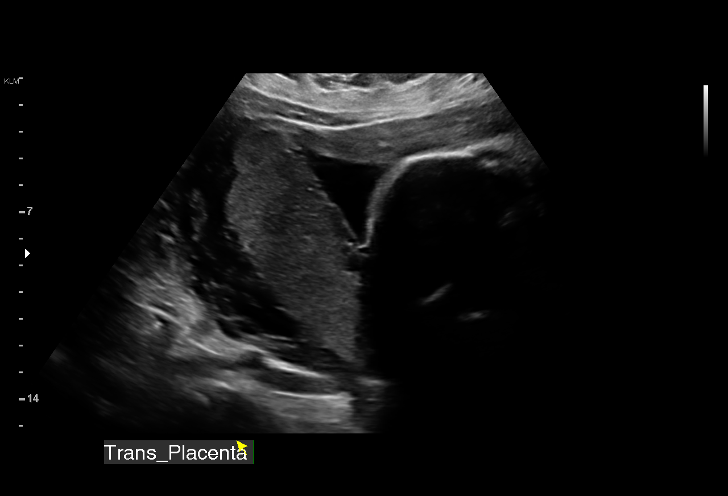
[im 11/32]
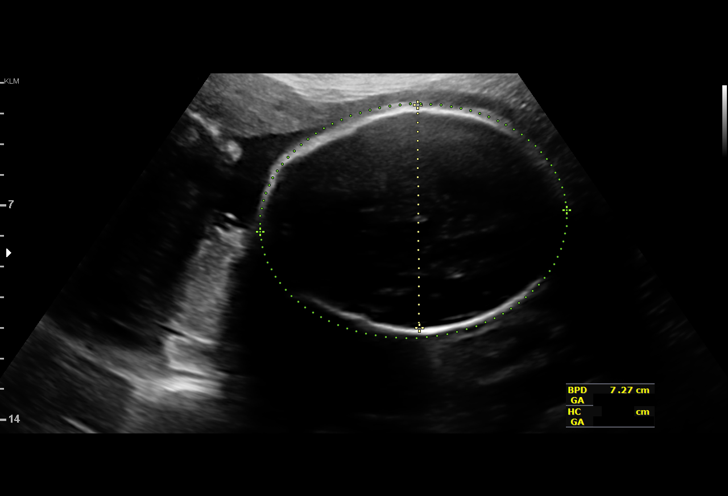
[im 13/32]
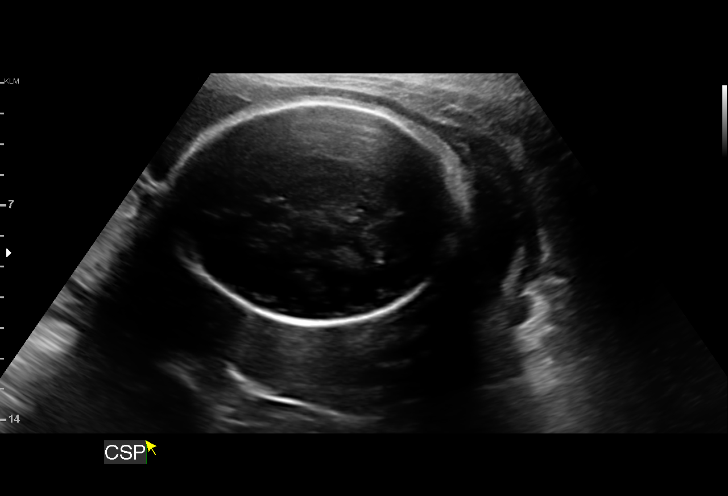
[im 15/32]
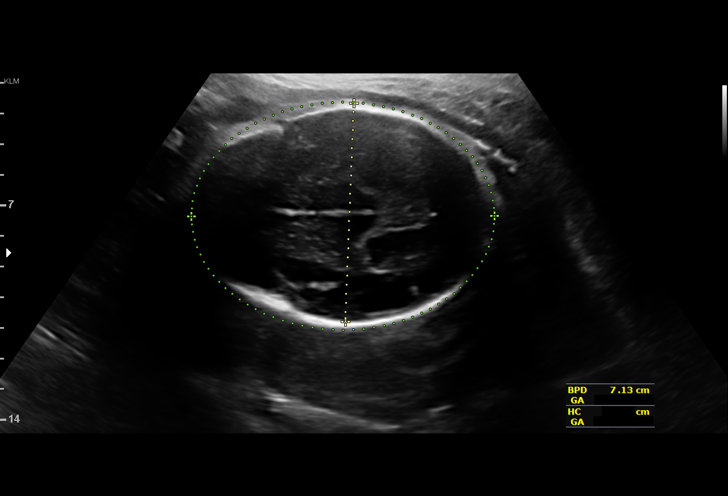
[im 18/32]
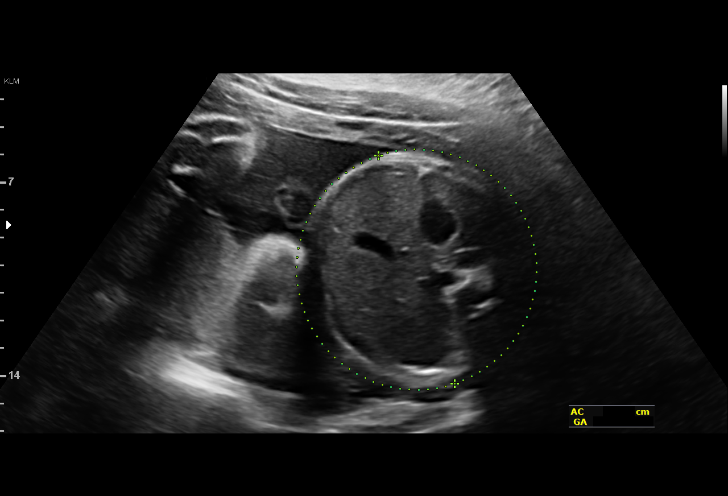
[im 20/32]
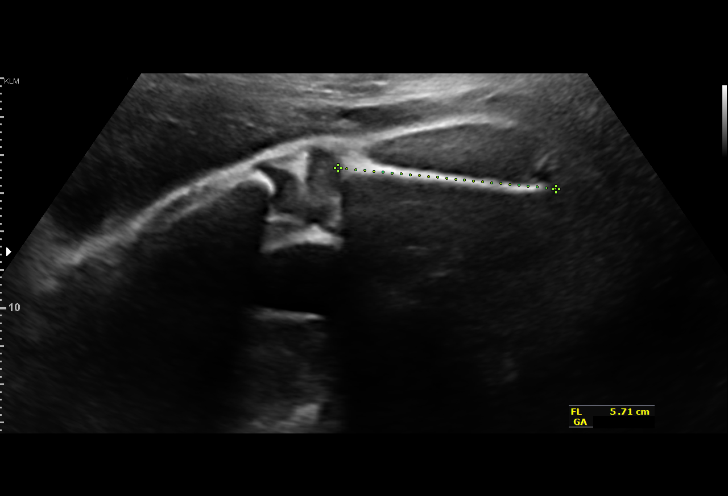
[im 22/32]
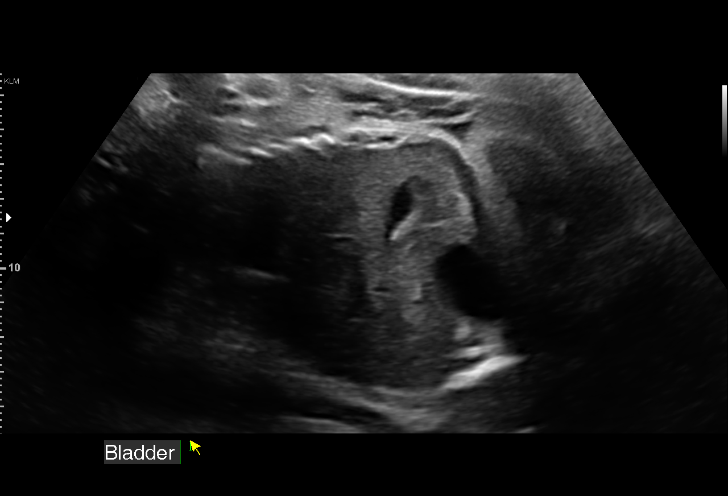
[im 25/32]
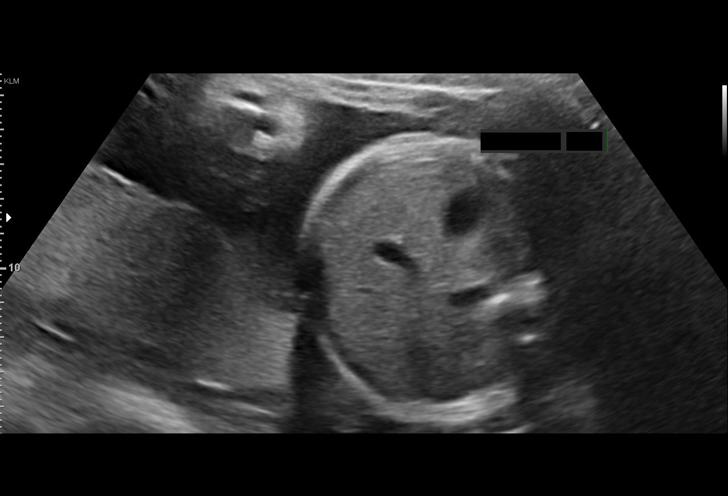
[im 27/32]
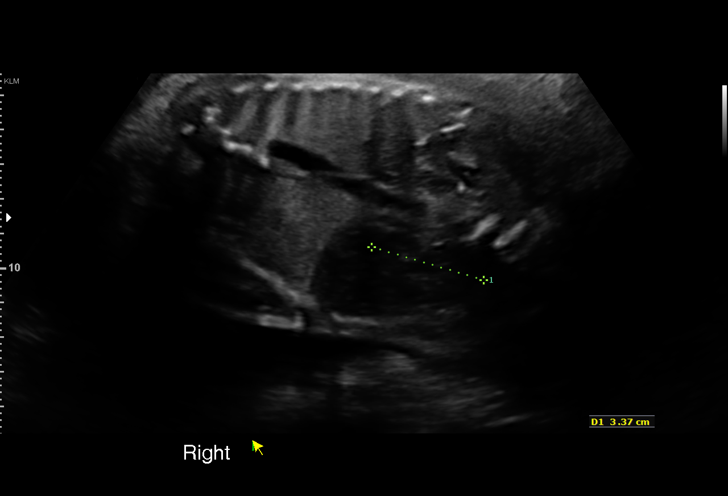
[im 29/32]
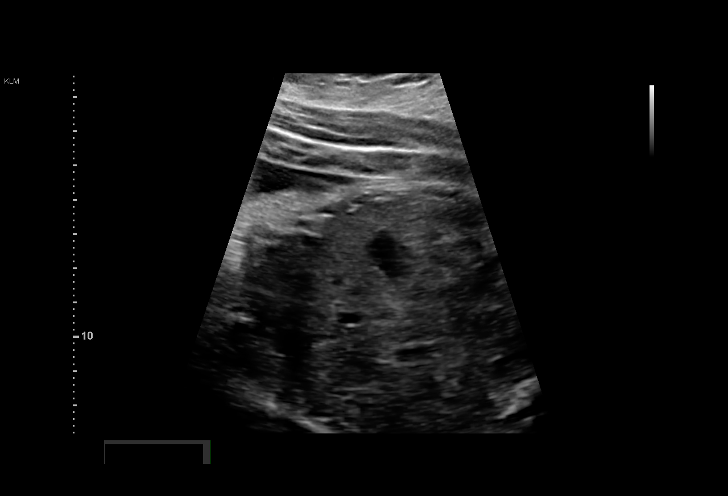
[im 32/32]
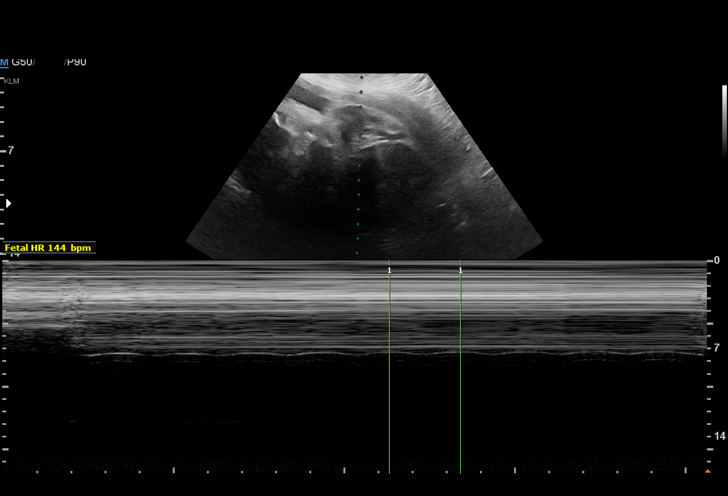

[14 of 28 positions shown; findings below may reference images not displayed]

Indications

29 weeks gestation of pregnancy
Maternal morbid obesity
Gestational diabetes in pregnancy, diet
controlled
Cervical shortening complicating pregnancy
(+FFN [DATE], BMZ [DATE])
OB History

Blood Type:            Height:  5'2"   Weight (lb):  257       BMI:  47
Gravidity:    1
Fetal Evaluation

Num Of Fetuses:     1
Fetal Heart         144
Rate(bpm):
Cardiac Activity:   Observed
Presentation:       Breech
Placenta:           Posterior, above cervical os
P. Cord Insertion:  Previously Visualized

Amniotic Fluid
AFI FV:      Subjectively within normal limits

AFI Sum(cm)     %Tile       Largest Pocket(cm)
15.7            56

RUQ(cm)       RLQ(cm)       LUQ(cm)        LLQ(cm)
4.2
Biometry

BPD:      72.3  mm     G. Age:  29w 0d         25  %    CI:        69.94   %    70 - 86
FL/HC:      20.0   %    19.6 -
HC:      275.8  mm     G. Age:  30w 1d         37  %    HC/AC:      1.01        0.99 -
AC:       272   mm     G. Age:  31w 2d         90  %    FL/BPD:     76.3   %    71 - 87
FL:       55.2  mm     G. Age:  29w 1d         26  %    FL/AC:      20.3   %    20 - 24

Est. FW:    7998  gm      3 lb 7 oz     69  %
Gestational Age

LMP:           29w 3d        Date:  02/22/16                 EDD:   11/28/16
U/S Today:     29w 6d                                        EDD:   11/25/16
Best:          29w 3d     Det. By:  LMP  (02/22/16)          EDD:   11/28/16
Anatomy

Cranium:               Appears normal         Aortic Arch:            Previously seen
Cavum:                 Appears normal         Ductal Arch:            Previously seen
Ventricles:            Appears normal         Diaphragm:              Appears normal
Choroid Plexus:        Previously seen        Stomach:                Appears normal, left
sided
Cerebellum:            Previously seen        Abdomen:                Appears normal
Posterior Fossa:       Previously seen        Abdominal Wall:         Previously seen
Nuchal Fold:           Not applicable (>20    Cord Vessels:           Previously seen
wks GA)
Face:                  Orbits and profile     Kidneys:                Appear normal
previously seen
Lips:                  Previously seen        Bladder:                Appears normal
Thoracic:              Appears normal         Spine:                  Previously seen
Heart:                 Previously seen        Upper Extremities:      Previously seen
RVOT:                  Previously seen        Lower Extremities:      Previously seen
LVOT:                  Previously seen

Other:  Heels prev visualized. Technically difficult due to maternal habitus
and fetal position.
Cervix Uterus Adnexa

Cervix
Not visualized (advanced GA >59wks)
Impression

SIUP at 29+3 weeks
active singleton fetus
Appropriate interval growth with EFW at the 69th %tile
Normal interval anatomy; anatomic survey complete
(umbilical cord cyst that was seen at initial anatomy scan is
not seen today--either resolved or not seen due to position)
Normal amniotic fluid volume

Recommendations

Interval growth in 4 weeks
